# Patient Record
Sex: Male | Born: 1940 | Race: White | Hispanic: No | Smoking: Former smoker
Health system: Southern US, Community
[De-identification: ages and names within clinical notes are randomized; demographics above are authoritative.]

## PROBLEM LIST (undated history)

## (undated) DIAGNOSIS — Z950 Presence of cardiac pacemaker: Secondary | ICD-10-CM

## (undated) DIAGNOSIS — I2510 Atherosclerotic heart disease of native coronary artery without angina pectoris: Secondary | ICD-10-CM

## (undated) DIAGNOSIS — J449 Chronic obstructive pulmonary disease, unspecified: Secondary | ICD-10-CM

## (undated) DIAGNOSIS — I5032 Chronic diastolic (congestive) heart failure: Secondary | ICD-10-CM

## (undated) DIAGNOSIS — G4733 Obstructive sleep apnea (adult) (pediatric): Secondary | ICD-10-CM

## (undated) DIAGNOSIS — I10 Essential (primary) hypertension: Secondary | ICD-10-CM

## (undated) DIAGNOSIS — K922 Gastrointestinal hemorrhage, unspecified: Secondary | ICD-10-CM

## (undated) DIAGNOSIS — E785 Hyperlipidemia, unspecified: Secondary | ICD-10-CM

## (undated) HISTORY — DX: Gastrointestinal hemorrhage, unspecified: K92.2

## (undated) HISTORY — DX: Hyperlipidemia, unspecified: E78.5

## (undated) HISTORY — PX: CORONARY ARTERY BYPASS GRAFT: SHX141

## (undated) HISTORY — DX: Chronic diastolic (congestive) heart failure: I50.32

## (undated) HISTORY — PX: CARDIAC PACEMAKER PLACEMENT: SHX583

## (undated) HISTORY — DX: Atherosclerotic heart disease of native coronary artery without angina pectoris: I25.10

## (undated) HISTORY — DX: Morbid (severe) obesity due to excess calories: E66.01

## (undated) HISTORY — DX: Obstructive sleep apnea (adult) (pediatric): G47.33

## (undated) HISTORY — DX: Presence of cardiac pacemaker: Z95.0

## (undated) HISTORY — DX: Chronic obstructive pulmonary disease, unspecified: J44.9

## (undated) HISTORY — PX: EYE SURGERY: SHX253

## (undated) HISTORY — PX: OTHER SURGICAL HISTORY: SHX169

## (undated) HISTORY — DX: Essential (primary) hypertension: I10

---

## 2006-03-07 ENCOUNTER — Other Ambulatory Visit: Payer: Self-pay

## 2007-03-07 ENCOUNTER — Ambulatory Visit: Payer: Self-pay | Admitting: Internal Medicine

## 2007-03-07 ENCOUNTER — Inpatient Hospital Stay (HOSPITAL_COMMUNITY): Admission: EM | Admit: 2007-03-07 | Discharge: 2007-03-23 | Payer: Self-pay | Admitting: Emergency Medicine

## 2007-03-08 ENCOUNTER — Ambulatory Visit: Payer: Self-pay | Admitting: Thoracic Surgery (Cardiothoracic Vascular Surgery)

## 2007-03-08 ENCOUNTER — Encounter (INDEPENDENT_AMBULATORY_CARE_PROVIDER_SITE_OTHER): Payer: Self-pay | Admitting: Cardiology

## 2007-04-12 ENCOUNTER — Ambulatory Visit: Payer: Self-pay

## 2007-04-18 ENCOUNTER — Ambulatory Visit: Payer: Self-pay | Admitting: Thoracic Surgery (Cardiothoracic Vascular Surgery)

## 2007-04-18 ENCOUNTER — Encounter
Admission: RE | Admit: 2007-04-18 | Discharge: 2007-04-18 | Payer: Self-pay | Admitting: Thoracic Surgery (Cardiothoracic Vascular Surgery)

## 2007-05-02 ENCOUNTER — Ambulatory Visit: Payer: Self-pay | Admitting: Thoracic Surgery (Cardiothoracic Vascular Surgery)

## 2007-05-02 ENCOUNTER — Encounter
Admission: RE | Admit: 2007-05-02 | Discharge: 2007-05-02 | Payer: Self-pay | Admitting: Thoracic Surgery (Cardiothoracic Vascular Surgery)

## 2007-06-15 ENCOUNTER — Other Ambulatory Visit: Payer: Self-pay

## 2007-06-19 ENCOUNTER — Ambulatory Visit: Payer: Self-pay | Admitting: Internal Medicine

## 2007-08-17 ENCOUNTER — Ambulatory Visit: Payer: Self-pay | Admitting: Thoracic Surgery

## 2008-04-24 ENCOUNTER — Other Ambulatory Visit: Payer: Self-pay

## 2008-11-28 ENCOUNTER — Inpatient Hospital Stay (HOSPITAL_COMMUNITY): Admission: EM | Admit: 2008-11-28 | Discharge: 2008-12-01 | Payer: Self-pay | Admitting: Emergency Medicine

## 2009-03-11 DIAGNOSIS — G4733 Obstructive sleep apnea (adult) (pediatric): Secondary | ICD-10-CM | POA: Insufficient documentation

## 2009-03-11 DIAGNOSIS — I10 Essential (primary) hypertension: Secondary | ICD-10-CM | POA: Insufficient documentation

## 2009-03-11 DIAGNOSIS — E785 Hyperlipidemia, unspecified: Secondary | ICD-10-CM | POA: Insufficient documentation

## 2009-03-27 ENCOUNTER — Encounter: Payer: Self-pay | Admitting: Internal Medicine

## 2009-08-19 ENCOUNTER — Encounter: Payer: Self-pay | Admitting: Cardiovascular Disease

## 2009-09-22 ENCOUNTER — Encounter: Payer: Self-pay | Admitting: Internal Medicine

## 2009-09-23 ENCOUNTER — Other Ambulatory Visit: Payer: Self-pay

## 2009-10-25 ENCOUNTER — Inpatient Hospital Stay (HOSPITAL_COMMUNITY): Admission: EM | Admit: 2009-10-25 | Discharge: 2009-10-29 | Payer: Self-pay | Admitting: Emergency Medicine

## 2009-10-25 ENCOUNTER — Encounter: Payer: Self-pay | Admitting: Family Medicine

## 2009-10-25 DIAGNOSIS — R5381 Other malaise: Secondary | ICD-10-CM

## 2009-10-25 DIAGNOSIS — J449 Chronic obstructive pulmonary disease, unspecified: Secondary | ICD-10-CM

## 2009-10-25 DIAGNOSIS — R0609 Other forms of dyspnea: Secondary | ICD-10-CM

## 2009-10-25 DIAGNOSIS — K921 Melena: Secondary | ICD-10-CM

## 2009-10-25 DIAGNOSIS — D649 Anemia, unspecified: Secondary | ICD-10-CM

## 2009-10-25 DIAGNOSIS — I2510 Atherosclerotic heart disease of native coronary artery without angina pectoris: Secondary | ICD-10-CM | POA: Insufficient documentation

## 2009-10-25 DIAGNOSIS — N259 Disorder resulting from impaired renal tubular function, unspecified: Secondary | ICD-10-CM | POA: Insufficient documentation

## 2009-10-25 DIAGNOSIS — J4489 Other specified chronic obstructive pulmonary disease: Secondary | ICD-10-CM | POA: Insufficient documentation

## 2009-10-25 DIAGNOSIS — R0989 Other specified symptoms and signs involving the circulatory and respiratory systems: Secondary | ICD-10-CM

## 2009-10-25 DIAGNOSIS — R5383 Other fatigue: Secondary | ICD-10-CM

## 2009-10-26 DIAGNOSIS — I5032 Chronic diastolic (congestive) heart failure: Secondary | ICD-10-CM

## 2009-10-26 DIAGNOSIS — I2589 Other forms of chronic ischemic heart disease: Secondary | ICD-10-CM

## 2009-11-21 ENCOUNTER — Encounter: Payer: Self-pay | Admitting: Cardiovascular Disease

## 2009-12-08 ENCOUNTER — Encounter: Payer: Self-pay | Admitting: Internal Medicine

## 2009-12-08 ENCOUNTER — Ambulatory Visit: Payer: Self-pay | Admitting: Cardiovascular Disease

## 2009-12-08 DIAGNOSIS — Z950 Presence of cardiac pacemaker: Secondary | ICD-10-CM | POA: Insufficient documentation

## 2010-01-02 ENCOUNTER — Ambulatory Visit: Payer: Self-pay | Admitting: Internal Medicine

## 2010-01-02 ENCOUNTER — Encounter: Payer: Self-pay | Admitting: Internal Medicine

## 2010-04-01 ENCOUNTER — Ambulatory Visit: Payer: Self-pay | Admitting: Internal Medicine

## 2010-04-03 ENCOUNTER — Encounter: Payer: Self-pay | Admitting: Internal Medicine

## 2010-04-22 ENCOUNTER — Encounter: Payer: Self-pay | Admitting: Internal Medicine

## 2010-06-11 ENCOUNTER — Ambulatory Visit: Payer: Self-pay | Admitting: Cardiovascular Disease

## 2010-07-10 ENCOUNTER — Encounter: Payer: Self-pay | Admitting: Internal Medicine

## 2010-07-30 ENCOUNTER — Ambulatory Visit: Payer: Self-pay | Admitting: Internal Medicine

## 2010-07-30 ENCOUNTER — Encounter: Payer: Self-pay | Admitting: Internal Medicine

## 2010-07-31 ENCOUNTER — Encounter (INDEPENDENT_AMBULATORY_CARE_PROVIDER_SITE_OTHER): Payer: Self-pay | Admitting: *Deleted

## 2010-08-07 ENCOUNTER — Encounter (INDEPENDENT_AMBULATORY_CARE_PROVIDER_SITE_OTHER): Payer: Self-pay | Admitting: *Deleted

## 2010-10-08 ENCOUNTER — Telehealth: Payer: Self-pay | Admitting: Cardiovascular Disease

## 2010-10-22 ENCOUNTER — Other Ambulatory Visit: Payer: Self-pay | Admitting: Emergency Medicine

## 2010-10-25 ENCOUNTER — Encounter: Payer: Self-pay | Admitting: Thoracic Surgery (Cardiothoracic Vascular Surgery)

## 2010-11-04 NOTE — Letter (Signed)
Summary: Remote Device Check  [employer], Main Office  [address]   [place], [place] [address]   Phone: [phone]  Fax: [phone]     April 22, 2010 MRN: [id number]   TABIAS SWAYZE [address] [place], [place]  [address]   Dear Mr. [facility],   Your remote transmission was recieved and reviewed by your physician.  All diagnostics were within normal limits for you.  __X___Your next transmission is scheduled for: 07-09-2010.  Please transmit at any time this day.  If you have a wireless device your transmission will be sent automatically.  ______Your next office visit is scheduled for:                              . Please call our office to schedule an appointment.    Sincerely,  Vella Kohler

## 2010-11-04 NOTE — Letter (Signed)
Summary: Remote Device Check  [employer], Main Office  [address]   [place], [place] [address]   Phone: [phone]  Fax: [phone]     August 07, 2010 MRN: [id number]   Scott Krause [address] [place], [place]  [address]   Dear Mr. [facility],   Your remote transmission was recieved and reviewed by your physician.  All diagnostics were within normal limits for you.  ___X__Your next transmission is scheduled for:11/05/2010.   Please transmit at any time this day.  If you have a wireless device your transmission will be sent automatically.  ______Your next office visit is scheduled for:                              . Please call our office to schedule an appointment.    Sincerely,  Altha Harm, LPN
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
## 2010-11-04 NOTE — Progress Notes (Signed)
Summary: PHI  PHI   Imported By: Harlon Flor 12/10/2009 08:42:27  _____________________________________________________________________  External Attachment:    Type:   Image     Comment:   External Document

## 2010-11-04 NOTE — Cardiovascular Report (Signed)
Summary: Office Visit Remote   Office Visit Remote   Imported By: Roderic Ovens 08/10/2010 16:18:50  _____________________________________________________________________  External Attachment:    Type:   Image     Comment:   External Document
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
## 2010-11-04 NOTE — Cardiovascular Report (Signed)
Summary: Office Visit Remote   Office Visit Remote   Imported By: Roderic Ovens 04/23/2010 10:58:24  _____________________________________________________________________  External Attachment:    Type:   Image     Comment:   External Document

## 2010-11-04 NOTE — Miscellaneous (Signed)
Summary: dx code correction  Clinical Lists Changes  Problems: Changed problem from PACEMAKER (ICD-V45.Marland Kitchen01) to PACEMAKER, PERMANENT (ICD-V45.01)  changed the incorrect dx code to correct dx code Genella Mech  July 31, 2010 10:40 AM

## 2010-11-04 NOTE — Letter (Signed)
Summary: Device-Delinquent Phone Journalist, newspaper, Main Office  [address]   [place], [place] [address]   Phone: [phone]  Fax: [phone]     July 10, 2010 MRN: [id number]   TASHON CAPP [address] [place], [place]  [address]   Dear Mr. [facility],  According to our records, you were scheduled for a device phone transmission on 07-09-2010.     We did not receive any results from this check.  If you transmitted on your scheduled day, please call us to help troubleshoot your system.  If you forgot to send your transmission, please send one upon receipt of this letter.  Thank you,   Architectural technologist Device Clinic

## 2010-11-05 ENCOUNTER — Ambulatory Visit: Admit: 2010-11-05 | Payer: Self-pay | Admitting: Internal Medicine

## 2010-11-05 NOTE — Progress Notes (Signed)
Summary: Problems  Phone Note Call from Patient Call back at Home Phone [phone]   Caller: Patient Call For: Lexa Coronado/nurse Summary of Call: pt was working out last night and feel a little discomfort. Pt states there is burning in stomach and feels weak. Initial call taken by: Lysbeth Galas CMA,  October 08, 2010 10:51 AM  Follow-up for Phone Call        Pt states that he is taking prednisone for Gout.  This could be the cause of some of his symptoms. Follow-up by: Harlon Flor,  October 08, 2010 11:02 AM  Additional Follow-up for Phone Call Additional follow up Details #1::        Spoke to pt this afternoon, he states he realized his symptoms were due to prednisone, and after he ate something it resolved.  Additional Follow-up by: Lanny Hurst RN,  October 08, 2010 5:03 PM

## 2010-11-06 ENCOUNTER — Telehealth (INDEPENDENT_AMBULATORY_CARE_PROVIDER_SITE_OTHER): Payer: Self-pay | Admitting: *Deleted

## 2010-11-09 ENCOUNTER — Encounter (INDEPENDENT_AMBULATORY_CARE_PROVIDER_SITE_OTHER): Payer: Self-pay | Admitting: *Deleted

## 2010-11-11 NOTE — Progress Notes (Signed)
  LOV,Echo,Cath faxed to [facility] @ [phone] [facility]  November 06, 2010 12:06 PM

## 2010-11-15 ENCOUNTER — Encounter: Payer: Self-pay | Admitting: Internal Medicine

## 2010-11-16 ENCOUNTER — Encounter (INDEPENDENT_AMBULATORY_CARE_PROVIDER_SITE_OTHER): Payer: MEDICARE

## 2010-11-16 DIAGNOSIS — I495 Sick sinus syndrome: Secondary | ICD-10-CM

## 2010-11-19 NOTE — Letter (Signed)
Summary: Device-Delinquent Phone Journalist, newspaper, Main Office  [address]   [place], [place] [address]   Phone: [phone]  Fax: [phone]     November 09, 2010 MRN: [id number]   ZARION OLIFF [address] [place], [place]  [address]   Dear Mr. [facility],  According to our records, you were scheduled for a device phone transmission on 11-05-2010.     We did not receive any results from this check.  If you transmitted on your scheduled day, please call us to help troubleshoot your system.  If you forgot to send your transmission, please send one upon receipt of this letter.  Thank you,   Architectural technologist Device Clinic

## 2010-12-10 ENCOUNTER — Encounter (INDEPENDENT_AMBULATORY_CARE_PROVIDER_SITE_OTHER): Payer: Self-pay | Admitting: *Deleted

## 2010-12-15 ENCOUNTER — Encounter: Payer: Self-pay | Admitting: Cardiovascular Disease

## 2010-12-15 NOTE — Letter (Signed)
Summary: Device-Delinquent Check  [facility], Main Office  [address]   [place], [place] [address]   Phone: [phone]  Fax: [phone]     December 10, 2010 MRN: [id number]   Scott Krause [address] [place], [place]  [address]   Dear Mr. [facility],  According to our records, you have not had your implanted device checked in the recommended period of time.  We are unable to determine appropriate device function without checking your device on a regular basis.  Please call our office to schedule an appointment as soon as possible.  If you are having your device checked by another physician, please call us so that we may update our records.  Thank you,  Letta Moynahan, EMT  December 10, 2010 1:50 PM  [facility]

## 2010-12-20 LAB — BASIC METABOLIC PANEL
BUN: 33 mg/dL — ABNORMAL HIGH (ref 6–23)
BUN: 66 mg/dL — ABNORMAL HIGH (ref 6–23)
CO2: 32 mEq/L (ref 19–32)
Calcium: 9.2 mg/dL (ref 8.4–10.5)
Chloride: 102 mEq/L (ref 96–112)
Chloride: 99 mEq/L (ref 96–112)
Creatinine, Ser: 1.3 mg/dL (ref 0.4–1.5)
GFR calc Af Amer: 52 mL/min — ABNORMAL LOW (ref >60)
GFR calc non Af Amer: 43 mL/min — ABNORMAL LOW (ref >60)
Potassium: 3.9 mEq/L (ref 3.5–5.1)
Sodium: 140 mEq/L (ref 135–145)

## 2010-12-20 LAB — COMPREHENSIVE METABOLIC PANEL WITH GFR
ALT: 13 U/L (ref 0–53)
ALT: 13 U/L (ref 0–53)
AST: 13 U/L (ref 0–37)
AST: 14 U/L (ref 0–37)
Albumin: 3.2 g/dL — ABNORMAL LOW (ref 3.5–5.2)
Albumin: 3.5 g/dL (ref 3.5–5.2)
Alkaline Phosphatase: 42 U/L (ref 39–117)
Alkaline Phosphatase: 44 U/L (ref 39–117)
BUN: 54 mg/dL — ABNORMAL HIGH (ref 6–23)
BUN: 63 mg/dL — ABNORMAL HIGH (ref 6–23)
CO2: 32 meq/L (ref 19–32)
CO2: 35 meq/L — ABNORMAL HIGH (ref 19–32)
Calcium: 8.8 mg/dL (ref 8.4–10.5)
Calcium: 9 mg/dL (ref 8.4–10.5)
Chloride: 90 meq/L — ABNORMAL LOW (ref 96–112)
Chloride: 92 meq/L — ABNORMAL LOW (ref 96–112)
Creatinine, Ser: 2.26 mg/dL — ABNORMAL HIGH (ref 0.4–1.5)
Creatinine, Ser: 2.29 mg/dL — ABNORMAL HIGH (ref 0.4–1.5)
GFR calc non Af Amer: 28 mL/min — ABNORMAL LOW
GFR calc non Af Amer: 29 mL/min — ABNORMAL LOW
Glucose, Bld: 112 mg/dL — ABNORMAL HIGH (ref 70–99)
Glucose, Bld: 209 mg/dL — ABNORMAL HIGH (ref 70–99)
Potassium: 3.8 meq/L (ref 3.5–5.1)
Potassium: 4.1 meq/L (ref 3.5–5.1)
Sodium: 137 meq/L (ref 135–145)
Sodium: 138 meq/L (ref 135–145)
Total Bilirubin: 0.5 mg/dL (ref 0.3–1.2)
Total Bilirubin: 1.2 mg/dL (ref 0.3–1.2)
Total Protein: 6.8 g/dL (ref 6.0–8.3)
Total Protein: 7.3 g/dL (ref 6.0–8.3)

## 2010-12-20 LAB — CBC
HCT: 26.7 % — ABNORMAL LOW (ref 39.0–52.0)
HCT: 27 % — ABNORMAL LOW (ref 39.0–52.0)
HCT: 27.4 % — ABNORMAL LOW (ref 39.0–52.0)
HCT: 28.1 % — ABNORMAL LOW (ref 39.0–52.0)
Hemoglobin: 9.4 g/dL — ABNORMAL LOW (ref 13.0–17.0)
MCHC: 33.1 g/dL (ref 30.0–36.0)
MCHC: 34 g/dL (ref 30.0–36.0)
MCHC: 35 g/dL (ref 30.0–36.0)
MCV: 88.2 fL (ref 78.0–100.0)
MCV: 89 fL (ref 78.0–100.0)
MCV: 89.3 fL (ref 78.0–100.0)
MCV: 89.4 fL (ref 78.0–100.0)
Platelets: 191 10*3/uL (ref 150–400)
Platelets: 193 K/uL (ref 150–400)
Platelets: 195 10*3/uL (ref 150–400)
Platelets: 215 10*3/uL (ref 150–400)
Platelets: 220 10*3/uL (ref 150–400)
RBC: 3 MIL/uL — ABNORMAL LOW (ref 4.22–5.81)
RBC: 3.06 MIL/uL — ABNORMAL LOW (ref 4.22–5.81)
RBC: 3.07 MIL/uL — ABNORMAL LOW (ref 4.22–5.81)
RBC: 3.15 MIL/uL — ABNORMAL LOW (ref 4.22–5.81)
RDW: 14.4 % (ref 11.5–15.5)
WBC: 10.6 10*3/uL — ABNORMAL HIGH (ref 4.0–10.5)
WBC: 12.2 10*3/uL — ABNORMAL HIGH (ref 4.0–10.5)
WBC: 16 10*3/uL — ABNORMAL HIGH (ref 4.0–10.5)
WBC: 9.3 K/uL (ref 4.0–10.5)

## 2010-12-20 LAB — CARDIAC PANEL(CRET KIN+CKTOT+MB+TROPI)
CK, MB: 1.3 ng/mL (ref 0.3–4.0)
CK, MB: 2.1 ng/mL (ref 0.3–4.0)
Relative Index: INVALID (ref 0.0–2.5)
Total CK: 102 U/L (ref 7–232)
Troponin I: 0.01 ng/mL (ref 0.00–0.06)
Troponin I: 0.01 ng/mL (ref 0.00–0.06)

## 2010-12-20 LAB — TECHNOLOGIST SMEAR REVIEW

## 2010-12-20 LAB — DIFFERENTIAL
Basophils Absolute: 0 K/uL (ref 0.0–0.1)
Basophils Relative: 0 % (ref 0–1)
Eosinophils Absolute: 0.1 K/uL (ref 0.0–0.7)
Eosinophils Relative: 1 % (ref 0–5)
Lymphocytes Relative: 9 % — ABNORMAL LOW (ref 12–46)
Lymphs Abs: 0.9 K/uL (ref 0.7–4.0)
Monocytes Absolute: 0.9 K/uL (ref 0.1–1.0)
Monocytes Relative: 9 % (ref 3–12)
Neutro Abs: 8.7 K/uL — ABNORMAL HIGH (ref 1.7–7.7)
Neutrophils Relative %: 81 % — ABNORMAL HIGH (ref 43–77)

## 2010-12-20 LAB — BRAIN NATRIURETIC PEPTIDE: Pro B Natriuretic peptide (BNP): 55 pg/mL (ref 0.0–100.0)

## 2010-12-20 LAB — BASIC METABOLIC PANEL WITH GFR
BUN: 54 mg/dL — ABNORMAL HIGH (ref 6–23)
CO2: 32 meq/L (ref 19–32)
Calcium: 8.6 mg/dL (ref 8.4–10.5)
Chloride: 100 meq/L (ref 96–112)
Creatinine, Ser: 1.42 mg/dL (ref 0.4–1.5)
GFR calc non Af Amer: 49 mL/min — ABNORMAL LOW
Glucose, Bld: 104 mg/dL — ABNORMAL HIGH (ref 70–99)
Potassium: 3.6 meq/L (ref 3.5–5.1)
Sodium: 140 meq/L (ref 135–145)

## 2010-12-20 LAB — POCT CARDIAC MARKERS
CKMB, poc: 1.2 ng/mL (ref 1.0–8.0)
Myoglobin, poc: 362 ng/mL (ref 12–200)
Troponin i, poc: <0.05 ng/mL (ref 0.00–0.09)

## 2010-12-20 LAB — HEMOCCULT GUIAC POC 1CARD (OFFICE): Fecal Occult Bld: POSITIVE

## 2010-12-20 LAB — CK TOTAL AND CKMB (NOT AT ARMC)
CK, MB: 0.7 ng/mL (ref 0.3–4.0)
Relative Index: INVALID (ref 0.0–2.5)
Total CK: 74 U/L (ref 7–232)

## 2010-12-20 LAB — TYPE AND SCREEN: ABO/RH(D): B POS

## 2010-12-20 LAB — SEDIMENTATION RATE: Sed Rate: 106 mm/h — ABNORMAL HIGH (ref 0–16)

## 2010-12-20 LAB — HEMOGLOBIN AND HEMATOCRIT, BLOOD
HCT: 27.8 % — ABNORMAL LOW (ref 39.0–52.0)
Hemoglobin: 9.3 g/dL — ABNORMAL LOW (ref 13.0–17.0)

## 2010-12-20 LAB — HEMOGLOBIN A1C: Mean Plasma Glucose: 126 mg/dL

## 2010-12-20 LAB — PREPARE RBC (CROSSMATCH)

## 2010-12-20 LAB — SODIUM, URINE, RANDOM: Sodium, Ur: 42 meq/L

## 2010-12-20 LAB — CREATININE, URINE, RANDOM: Creatinine, Urine: 72 mg/dL

## 2010-12-20 LAB — PATHOLOGIST SMEAR REVIEW

## 2010-12-21 ENCOUNTER — Encounter: Payer: Self-pay | Admitting: *Deleted

## 2010-12-22 ENCOUNTER — Encounter: Payer: Self-pay | Admitting: Cardiovascular Disease

## 2010-12-22 ENCOUNTER — Ambulatory Visit (INDEPENDENT_AMBULATORY_CARE_PROVIDER_SITE_OTHER): Payer: Medicare Other | Admitting: Cardiovascular Disease

## 2010-12-22 VITALS — BP 141/81 | HR 84 | Ht 68.0 in | Wt 266.0 lb

## 2010-12-22 DIAGNOSIS — I5032 Chronic diastolic (congestive) heart failure: Secondary | ICD-10-CM

## 2010-12-22 DIAGNOSIS — E669 Obesity, unspecified: Secondary | ICD-10-CM

## 2010-12-22 DIAGNOSIS — G4733 Obstructive sleep apnea (adult) (pediatric): Secondary | ICD-10-CM

## 2010-12-22 DIAGNOSIS — Z951 Presence of aortocoronary bypass graft: Secondary | ICD-10-CM

## 2010-12-22 DIAGNOSIS — E785 Hyperlipidemia, unspecified: Secondary | ICD-10-CM

## 2010-12-22 DIAGNOSIS — I10 Essential (primary) hypertension: Secondary | ICD-10-CM

## 2010-12-22 DIAGNOSIS — I2510 Atherosclerotic heart disease of native coronary artery without angina pectoris: Secondary | ICD-10-CM

## 2010-12-22 MED ORDER — ATORVASTATIN CALCIUM 40 MG PO TABS
20.0000 mg | ORAL_TABLET | Freq: Every day | ORAL | Status: DC
Start: 2010-12-22 — End: 2011-03-30

## 2010-12-22 NOTE — Progress Notes (Deleted)
   Patient ID: Scott Krause, male    DOB: 04-11-1941, 70 y.o.   MRN: [id number]  HPI    Review of Systems    Physical Exam

## 2010-12-22 NOTE — Assessment & Plan Note (Signed)
No active signs of heart failure. He will continue on Lasix in the morning. We will hold his Bumex given that they are both loop diuretics. He is watching his salt intake, fluid intake. He is also losing weight which will help his shortness of breath.

## 2010-12-22 NOTE — Assessment & Plan Note (Signed)
We will change him to Lipitor 20 mg daily, check his cholesterol in 3 months and titrated up to 40 mg if needed. Goal LDL is less than 70.

## 2010-12-22 NOTE — Assessment & Plan Note (Signed)
last catheterization in November 2010 showing patent grafts. No symptoms of angina at this time. We'll not perform any testing.

## 2010-12-22 NOTE — Assessment & Plan Note (Signed)
And blood pressure is well controlled on his medication regimen. No changes made.

## 2010-12-22 NOTE — Progress Notes (Addendum)
   Patient ID: Scott Krause, male    DOB: 26-May-1941, 70 y.o.   MRN: [id number]  HPI Scott Krause is a 70 year old gentleman with obesity, coronary artery disease, bypass surgery with severe native three-vessel disease, obstructive sleep apnea, left catheterization Feb 2010 showing patent grafts, with pacemaker for tachybrady syndrome,  who presents for routine follow up.   Overall he reports that he is doing well. He has been playing golf, denies any chest pain or shortness of breath. He is interested in changing his Crestor to something cheaper, such as Lipitor. He also reports that he is taking furosemide as well as Bumex. He knows they are the same and is uncertain why he is taking both. He continues to struggle with his weight and reports that he is walking on an occasional basis and try to watch his diet.  He had pneumonia in January 2011 and was admitted to [facility] for antibiotics. He was diagnosed with upper GI bleed and had an outpatient EGD in February 2011. He was discharged initially on Avelox. His kidney function was abnormal on admission with a creatinine greater than 2 and BUN 54 consistent with dehydration. His lower extremity edema has been stable and he is cut back on his diuretic from two a day to one a day.  EKG shows A-paced rhythm with rate 84 beats per minute, no significant ST or T wave changes.   Review of Systems  Constitutional: Negative.   HENT: Negative.   Eyes: Negative.   Respiratory: Negative.   Cardiovascular: Negative.   Gastrointestinal: Negative.   Musculoskeletal: Negative.   Skin: Negative.   Neurological: Negative.   Hematological: Negative.   Psychiatric/Behavioral: Negative.       Physical Exam  Constitutional: He appears well-developed and well-nourished.       Obese  HENT:  Head: Normocephalic and atraumatic.  Eyes: Conjunctivae are normal. Pupils are equal, round, and reactive to light.  Neck: Neck supple. No JVD present.    Cardiovascular: Normal rate, regular rhythm, normal heart sounds and intact distal pulses.   No murmur heard. Pulmonary/Chest: Effort normal and breath sounds normal. No respiratory distress. He has no rales.  Abdominal: Soft. Bowel sounds are normal.       obese  Musculoskeletal: Normal range of motion.       Trace LE edema above the socks  Skin: Skin is warm and dry. No rash noted. No erythema.  Psychiatric: He has a normal mood and affect. His behavior is normal. Judgment and thought content normal.      EKG shows paced rhythm, rate 84 beats per minute.

## 2010-12-22 NOTE — Patient Instructions (Addendum)
Your physician recommends that you schedule a follow-up appointment in: 6 months  Your physician recommends that you return for lab work in: 3 months   

## 2010-12-31 NOTE — Letter (Signed)
Summary: Remote Device Check  [employer], Main Office  [address]   [place], [place] [address]   Phone: [phone]  Fax: [phone]     December 21, 2010 MRN: [id number]   KONG [facility]T [address] [place], [place]  [address]   Dear Mr. [facility],   Your remote transmission was recieved and reviewed by your physician.  All diagnostics were within normal limits for you.   ___X___Your next office visit is scheduled for:  03-11-11 @ 930 with Dr Ladona Ridgel.     Sincerely,  Vella Kohler

## 2010-12-31 NOTE — Cardiovascular Report (Signed)
Summary: Office Visit   Office Visit   Imported By: Roderic Ovens 12/22/2010 15:08:31  _____________________________________________________________________  External Attachment:    Type:   Image     Comment:   External Document

## 2011-01-19 LAB — COMPREHENSIVE METABOLIC PANEL
ALT: 19 U/L (ref 0–53)
AST: 16 U/L (ref 0–37)
Albumin: 3.7 g/dL (ref 3.5–5.2)
Alkaline Phosphatase: 58 U/L (ref 39–117)
BUN: 24 mg/dL — ABNORMAL HIGH (ref 6–23)
CO2: 31 mEq/L (ref 19–32)
Calcium: 9.6 mg/dL (ref 8.4–10.5)
Chloride: 106 mEq/L (ref 96–112)
Creatinine, Ser: 1.16 mg/dL (ref 0.4–1.5)
GFR calc Af Amer: >60 mL/min (ref >60)
GFR calc non Af Amer: >60 mL/min (ref >60)
Glucose, Bld: 97 mg/dL (ref 70–99)
Potassium: 4.2 mEq/L (ref 3.5–5.1)
Sodium: 144 mEq/L (ref 135–145)
Total Bilirubin: 0.5 mg/dL (ref 0.3–1.2)
Total Protein: 6.5 g/dL (ref 6.0–8.3)

## 2011-01-19 LAB — LIPID PANEL
Cholesterol: 121 mg/dL (ref 0–200)
HDL: 23 mg/dL — ABNORMAL LOW (ref >39)
Triglycerides: 127 mg/dL (ref <150)

## 2011-01-19 LAB — CBC
HCT: 37.6 % — ABNORMAL LOW (ref 39.0–52.0)
Hemoglobin: 12 g/dL — ABNORMAL LOW (ref 13.0–17.0)
Hemoglobin: 12.2 g/dL — ABNORMAL LOW (ref 13.0–17.0)
Hemoglobin: 13 g/dL (ref 13.0–17.0)
Hemoglobin: 13.2 g/dL (ref 13.0–17.0)
MCHC: 34.7 g/dL (ref 30.0–36.0)
MCHC: 34.8 g/dL (ref 30.0–36.0)
MCV: 89.8 fL (ref 78.0–100.0)
Platelets: 182 10*3/uL (ref 150–400)
RBC: 3.83 MIL/uL — ABNORMAL LOW (ref 4.22–5.81)
RBC: 3.9 MIL/uL — ABNORMAL LOW (ref 4.22–5.81)
RBC: 4.19 MIL/uL — ABNORMAL LOW (ref 4.22–5.81)
RDW: 13.3 % (ref 11.5–15.5)
RDW: 13.5 % (ref 11.5–15.5)
WBC: 10.3 10*3/uL (ref 4.0–10.5)
WBC: 7.9 10*3/uL (ref 4.0–10.5)
WBC: 9.3 10*3/uL (ref 4.0–10.5)

## 2011-01-19 LAB — BASIC METABOLIC PANEL
CO2: 28 mEq/L (ref 19–32)
CO2: 29 mEq/L (ref 19–32)
Calcium: 8.9 mg/dL (ref 8.4–10.5)
Calcium: 9.1 mg/dL (ref 8.4–10.5)
Chloride: 104 mEq/L (ref 96–112)
Chloride: 105 mEq/L (ref 96–112)
Creatinine, Ser: 1.1 mg/dL (ref 0.4–1.5)
GFR calc Af Amer: >60 mL/min (ref >60)
GFR calc Af Amer: >60 mL/min (ref >60)
GFR calc non Af Amer: >60 mL/min (ref >60)
Glucose, Bld: 106 mg/dL — ABNORMAL HIGH (ref 70–99)
Glucose, Bld: 95 mg/dL (ref 70–99)
Potassium: 3.8 mEq/L (ref 3.5–5.1)
Sodium: 138 mEq/L (ref 135–145)
Sodium: 138 mEq/L (ref 135–145)
Sodium: 142 mEq/L (ref 135–145)

## 2011-01-19 LAB — MAGNESIUM: Magnesium: 2 mg/dL (ref 1.5–2.5)

## 2011-01-19 LAB — CARDIAC PANEL(CRET KIN+CKTOT+MB+TROPI)
CK, MB: 2.3 ng/mL (ref 0.3–4.0)
CK, MB: 2.4 ng/mL (ref 0.3–4.0)
Relative Index: INVALID (ref 0.0–2.5)
Relative Index: INVALID (ref 0.0–2.5)
Total CK: 53 U/L (ref 7–232)
Total CK: 80 U/L (ref 7–232)
Troponin I: <0.01 ng/mL (ref 0.00–0.06)
Troponin I: <0.01 ng/mL (ref 0.00–0.06)

## 2011-01-19 LAB — POCT I-STAT, CHEM 8
BUN: 28 mg/dL — ABNORMAL HIGH (ref 6–23)
Calcium, Ion: 1.02 mmol/L — ABNORMAL LOW (ref 1.12–1.32)
Chloride: 107 mEq/L (ref 96–112)
Creatinine, Ser: 1.4 mg/dL (ref 0.4–1.5)
Glucose, Bld: 98 mg/dL (ref 70–99)
HCT: 37 % — ABNORMAL LOW (ref 39.0–52.0)
Hemoglobin: 12.6 g/dL — ABNORMAL LOW (ref 13.0–17.0)
Potassium: 4.1 mEq/L (ref 3.5–5.1)
Sodium: 140 mEq/L (ref 135–145)
TCO2: 27 mmol/L (ref 0–100)

## 2011-01-19 LAB — PROTIME-INR: Prothrombin Time: 12.6 seconds (ref 11.6–15.2)

## 2011-01-19 LAB — POCT CARDIAC MARKERS
CKMB, poc: 1.9 ng/mL (ref 1.0–8.0)
Myoglobin, poc: 107 ng/mL (ref 12–200)
Troponin i, poc: <0.05 ng/mL (ref 0.00–0.09)

## 2011-01-19 LAB — APTT: aPTT: 23 seconds — ABNORMAL LOW (ref 24–37)

## 2011-01-19 LAB — DIFFERENTIAL
Lymphocytes Relative: 16 % (ref 12–46)
Lymphs Abs: 1.6 10*3/uL (ref 0.7–4.0)
Neutro Abs: 7.9 10*3/uL — ABNORMAL HIGH (ref 1.7–7.7)
Neutrophils Relative %: 77 % (ref 43–77)

## 2011-01-19 LAB — TSH: TSH: 1.817 u[IU]/mL (ref 0.350–4.500)

## 2011-01-19 LAB — HEPARIN LEVEL (UNFRACTIONATED)
Heparin Unfractionated: 0.12 IU/mL — ABNORMAL LOW (ref 0.30–0.70)
Heparin Unfractionated: <0.1 IU/mL — ABNORMAL LOW (ref 0.30–0.70)

## 2011-01-19 LAB — CK TOTAL AND CKMB (NOT AT ARMC): Total CK: 71 U/L (ref 7–232)

## 2011-01-25 ENCOUNTER — Other Ambulatory Visit: Payer: Self-pay

## 2011-01-25 MED ORDER — FUROSEMIDE 40 MG PO TABS: 40.0000 mg | ORAL_TABLET | Freq: Every day | ORAL | Status: DC

## 2011-02-01 ENCOUNTER — Other Ambulatory Visit: Payer: Self-pay | Admitting: Emergency Medicine

## 2011-02-01 MED ORDER — FUROSEMIDE 40 MG PO TABS: 40.0000 mg | ORAL_TABLET | Freq: Every day | ORAL | Status: DC

## 2011-02-05 ENCOUNTER — Other Ambulatory Visit: Payer: Self-pay

## 2011-02-05 MED ORDER — RAMIPRIL 10 MG PO CAPS: 10.0000 mg | ORAL_CAPSULE | Freq: Every day | ORAL | Status: DC

## 2011-02-16 NOTE — Consult Note (Signed)
NAMEANTWOIN, LACKEY NO.:  [id number]   MEDICAL RECORD NO.:  [id number]          PATIENT TYPE:  INP   LOCATION:  2316                         FACILITY:  [facility]   PHYSICIAN:  Duke Salvia, MD, FACCDATE OF BIRTH:  05/26/1941   DATE OF CONSULTATION:  03/16/2007  DATE OF DISCHARGE:                                 CONSULTATION   Thank you very much for asking me to see Scott Krause in  consultation because of pauses and ventricular tachycardia.   Scott Krause is a 70 year old gentleman with a 1-year history of angina  who was admitted for catheterization and submitted for bypass surgery  because of significant three-vessel disease.  Left ventricular was  relatively normal at 50%.   He underwent successful bypass surgery plan, but his postoperative  course was notable for:   1. Paroxysmal atrial arrhythmias.  2. A significant post termination pause on June 10 that occurred at      5:40 in the morning where he had 1 beat in about 9 seconds that was      associated with presyncope and preceded by some palpitations which      he is not sure whether he recognized or did not recognize and then      subsequently ventricular tachycardia primarily in the last 24 hours      that also creates a fluttering which he thinks he recalls from      before surgery.   The patient has had a couple of episodes of presyncope.  These have  occurred while doing things like walking around the grocery store and  have been rather brief.   PAST MEDICAL HISTORY:  Notable for COPD, obstructive sleep apnea,  dyslipidemia, hypertension.   SOCIAL HISTORY:  He is married.  He has two children.  He does not  smoke.  He does drink occasionally.  Denies use of recreational drugs.   REVIEW OF SYSTEMS:  As noted previously or is noncontributory.   MEDICATIONS:  1. Amlodipine 10 currently on hold.  2. Protonix.  3. Tricor 145.  4. Lopressor 25.  5. Insulin h.s.  6. Norvasc 10.  7.  Aspirin 81.  8. Crestor.  9. Amiodarone 400 b.i.d..   ALLERGIES:  He has no known drug allergies.   PHYSICAL EXAMINATION:  GENERAL:  He is an older Caucasian male appearing  his stated age of 36.  VITAL SIGNS:  Blood pressure 130/47, pulse 73.  HEENT:  Demonstrated no icterus or xanthoma.  NECK:  The neck veins were flat.  Carotids were brisk and full  bilaterally without bruits.  BACK:  Without kyphosis, scoliosis.  LUNGS:  Lung sounds were rather coarse with decreased sounds  particularly in the right base.  HEART:  Heart sounds were regular without a rub.  ABDOMEN:  Soft with active bowel sounds.  EXTREMITIES:  Femoral pulses were not examined.  Distal pulses were  trace.  There is no clubbing or cyanosis.  There is a little bit of  edema.  NEUROLOGICAL:  Grossly normal.  SKIN:  Warm and dry.   Electrocardiogram dated 10  July demonstrated sinus rhythm at 67 with  intervals of 0.16/0.11/0.42.  The axis was 45 degrees.  There is an R  prime in lead V1.  There is evidence of a possible prior inferior wall  MI.   Electrocardiogram with nonsustained ventricular tachycardia was caught  on two serendipitous occasions both consistent with a left bundle  inferior axis morphology with a relatively narrow QRS.   IMPRESSION:  1. 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  2. Postoperative atrial fibrillation and flutter with a post      termination pause.  3. Ischemic cardiomyopathy.      a.     Status post CABG.      b.     Preoperative ejection fraction 50%.  4. Chronic obstructive pulmonary disease - severe.  5. Obstructive sleep apnea.  6. Thromboembolic risk factors notable for hypertension.  7. Prior presyncope.  8. Amiodarone therapy currently for #1 and #2.   DISCUSSION:  Scott Krause has a number of arrhythmias.  The first is the  atrial fibrillation and flutter which is seen  postoperatively with a  rapid ventricular response.  He has never been told that he has had  atrial fibrillation before; he does not recognize the words in any case.  He does have a history of antecedent palpitations, however (see below).  The postoperative phase has been complicated by these atrial  arrhythmias, and he has significant post termination pausing.  This is a  second major issue.  These are related in that the likelihood of his  having future atrial fibrillation directly relates to his likelihood of  having post termination pauses, and with the significant pauses that he  has symptoms are likely of presyncope and syncope.  I would have a very  low threshold for considering pacing for preventing post termination  pauses in this gentleman who is a) relatively older and  ) has COPD both  of which are contributors to the likelihood of recurrent atrial  fibrillation especially in the context of his hypertensive heart disease  and diastolic dysfunction.   The third issue is his ventricular tachycardia.  Morphologically this  appears to be emerging from the right ventricular outflow tract which  would make it most likely unrelated to his coronary artery disease.  It  is likely being provoked by the high catecholamine situation following  surgery.  What is interesting is that it was somewhat delayed before it  manifested, and I wonder whether this has something to do with the  dosing of his beta blockers that may have been adjusted around the time  of his post termination pause that was described as asystole.   In any case, given the fact that he has had palpitations before it is  not clear whether the ventricular tachycardia or atrial fibrillation is  responsible for that.  In the absence of symptoms of syncope or  significant palpitations, nothing specific need be done about this  ventricular tachycardia.  In the event that he does continue to have presyncope, an implanted device  would certainly allow [place] to correlate  whether the episode related to atrial fibrillation or ventricular  tachycardia, and then further therapies could be directed specifically  using drug therapy with beta blockers or calcium channel blockers or  catheter ablation.   It is, however, imperative to exclude changes in his heart status that  might alternatively explain this ventricular  arrhythmia.  This could  include myocardial ischemia, myocardial infarction, changes in the  electromechanical coupling, related changes in hemodynamics.   RECOMMENDATIONS:  Based on the above I would therefore:  1. Check serial enzymes.  2. Check a 2-D echo.  3. Likely proceed with pacemaker implantation.  4. Increase his beta blockers.  5. Will need to clarify arrhythmia mechanism in the event that he has      future symptoms of presyncope.   Thank you very much for the consultation.      Duke Salvia, MD, [facility]  Electronically Signed     SCK/MEDQ  D:  03/16/2007  T:  03/17/2007  Job:  [id number]   cc:   Cristy Hilts. Jacinto Halim, MD  Salvatore Decent Dorris Fetch, M.D.  Dewaine Oats

## 2011-02-16 NOTE — Letter (Signed)
August 17, 2007   Cristy Hilts. Jacinto Halim, MD  [address]  [place], [place]  [address]   Re:  JERIMIAH, WOLMAN              DOB:  1941/01/06   Dear Dr. Jacinto Halim:   I saw Mr. Jentz in the office today for Dr. Dorris Fetch who is at [place].  His blood pressure is 155/71, pulse 86, respirations 20, sats  were 90%.  He returned over here for follow-up of pleural effusion. I  reviewed his CT scan and his chest x-ray and does have a small left  pleural effusion but really not enough that I think it is symptomatic or  requires intervention at this time.  I think the best thing for me to do  is just to follow this.  His lungs are clear to auscultation and  percussion bilaterally.  He has a pacemaker inserted.  He is being  followed by Dr. Graciela Husbands.  I will have Dr. Dorris Fetch see him with a chest  x-ray in four weeks.   Ines Bloomer, M.D.  Electronically Signed   DPB/MEDQ  D:  08/17/2007  T:  08/18/2007  Job:  [id number]

## 2011-02-16 NOTE — Discharge Summary (Signed)
Scott Krause, JAQUITH NO.:  [id number]   MEDICAL RECORD NO.:  [id number]          PATIENT TYPE:  INP   LOCATION:  2015                         FACILITY:  [facility]   PHYSICIAN:  Salvatore Decent. Dorris Fetch, M.D.DATE OF BIRTH:  04/13/1941   DATE OF ADMISSION:  03/07/2007  DATE OF DISCHARGE:  03/22/2007                               DISCHARGE SUMMARY   DISCHARGE DIAGNOSES:  1. 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  2. Repetitive ventricular tachycardia with a left bundle branch block,      inferior axis morphology consistent with right ventricular outflow      tract mechanism.  3. Postoperative atrial fibrillation/flutter with post termination      pause.  4. Volume overload.  5. Acute blood loss anemia postoperatively.  6. Remote tobacco abuse.  The patient quit 10 years ago.  7. Obstructive sleep apnea.  8. Hyperlipidemia.  9. Hypertension.  10.Severe chronic obstructive pulmonary disease.   PROCEDURES:  1. Coronary bypass grafting x4 using a left internal mammary artery to      left anterior descending artery, saphenous vein graft to first      diagonal, saphenous vein graft to obtuse marginal-2, saphenous vein      graft to posterior descending.  2. Placement of dual chamber pacemaker.  3. Cardiac catheterization.   HISTORY OF PRESENT ILLNESS:  Scott Krause is a 70 year old gentleman with  about a 1-year history of angina.  He has recently had progression of  symptoms and was admitted with diastolic heart failure and unstable  angina.  The patient was taken for cardiac catheterization.  This showed  severe three-vessel coronary artery disease.  Dr. Dorris Fetch was  consulted.  Dr. Dorris Fetch saw and evaluated the patient.  He discussed  with the patient undergoing coronary bypass grafting.  Risks and  benefits were discussed with the patient.  The patient acknowledged  understanding and agreed to proceed.  Surgery was scheduled for March 10, 2007.  Preoperatively, the patient underwent bilateral carotid duplex  ultrasound showing no significant ICA stenosis.  He had palpable pulses  with extremities x4.  The patient remained stable preoperatively.  For  details of the patient's past medical history and physical exam, please  see dictated H&P.   HOSPITAL COURSE:  The patient was taken to the operating room on March 10, 2007 where he underwent coronary artery bypass grafting x4 using a left  internal mammary artery to left anterior descending artery, saphenous  vein graft to first diagonal, saphenous vein graft to obtuse marginal-2,  saphenous vein graft to posterior descending.  The patient tolerated  this procedure well and was transferred to the intensive care unit in  stable condition.  Postoperatively, the patient was noted to be  hemodynamically stable.  He was extubated evening of surgery.  Following  extubation, the patient noted to be alert and oriented x4.  On postop  day #1, the patient's vital signs were noted to be stable.  He was able  to be weaned from drips.  Postoperative chest x-ray was stable with no  pneumothorax or significant pleural  effusion.  Chest tube drainage was  stable.  Chest tubes were discontinued on postop day #1.  The patient  continued to progress well over the next several days.  He was out of  bed ambulating with assistance.  He was transferred out to 2000 postop  day #3.  The evening of transfer, the patient became confused and  disoriented to time and place.  The patient would not keep CPAP on and  we were contacted.  We were able to replace CPAP with saturations  maintained greater than 90%..  The following morning early, the patient  was noted to have developed ventricular tachycardia.  The patient had  multiple beats of ventricular tachycardia.  He then developed  bradycardia down in the 30s followed by atrial fibrillation with rate in  the 130s.  Dr. Donata Clay was consulted and the  patient was transferred  back to 2300.  The patient was started on IV amiodarone for  postoperative atrial fibrillation.  He did convert back to normal sinus  rhythm.  The patient remained in normal sinus rhythm overnight and  amiodarone was switched to p.o. dose.  During next day, the patient  continued to have brief episodes of tachycardia as well as atrial  fibrillation.  Dr. Graciela Husbands was consulted for evaluation.  Dr. Graciela Husbands noted  the patient had recurrence of ventricular tachycardia with left bundle  branch block with inferior axis morphology consistent with RVOT  mechanism.  Also, no postoperative atrial fibrillation with flutter with  a post termination pause.  At time, questioned placement of pacemaker.  After further discussion with the patient and Dr. Tyrone Sage, pacemaker  was placed on March 20, 2007, without difficulty.  It was interrogated  the following day and noted to be functioning well.  Prior to  implantation of pacemaker, the patient was started on Coumadin.  This  was discontinued after implantation of pacemaker.  The patient noted to  be in normal sinus rhythm prior to discharge.  The patient was continued  on Toprol as well as Norvasc.  Blood pressure is noted to be stable.  From a pulmonary status, the patient remained stable.  He was able to  continue working on incentive spirometer and be weaned off oxygen  saturating greater than 90% on room air.  The patient's incisions are  clean, dry and intact and healing well.  His vital signs were monitored.  They are noted to be stable.  The patient is afebrile.  The patient  continued to ambulate well with cardiac rehab.  He was tolerating diet  well with no nausea or vomiting noted.  The patient did develop some  volume overload postoperatively requiring several diuretics.  He is back  near baseline prior to discharge home.   The patient is tentatively ready for discharge home in the next 1-2 days.   LABORATORY DATA AND  X-RAY FINDINGS:  On June 15, labs showed a white  count of 14.7, hemoglobin 10.8, hematocrit 31.6, platelet count 433.  Sodium of 140, potassium 4.3, chloride of 102, bicarb of 30, BUN of 39,  creatinine 1.33, glucose 99.  INR on June 18, was 1.1.   FOLLOW UP:  Followup appointment has been arranged with Dr. Dorris Fetch  for April 18, 2007, at 2 p.m.  The patient will need to obtain a PA and  lateral chest x-ray at 1:30 p.m. on this date.  The patient need to  follow up Dr. Rana Snare in the 2 weeks.  He will need  to contact Dr. Vance Gather  office to make these arrangements.   ACTIVITY:  The patient instructed in no driving, no lifting over 10  pounds.  The patient is told to ambulate three to four times per day as  tolerated and to continue breathing exercises.   WOUND CARE:  The patient is told to shower washing his incisions using  soap and water.  He is to contact the office if he develops any drainage  or opening from any incision sites.   DIET:  The patient is to continue on diet to be low-fat, low-salt.   DISCHARGE MEDICATIONS:  1. Aspirin 325 mg daily.  2. Lopressor 50 mg t.i.d.  3. Crestor 5 mg at night.  4. Norvasc 10 mg daily.  5. Tricor 145 mg daily.  6. Zetia 10 mg daily.  7. Amiodarone 200 mg daily.  8. Oxycodone 5 mg one to two tablets q.4-6 h. p.r.n. pain.  9. Plan to start an ARB as an outpatient.  It was not started during      his postoperative course secondary to creatinine being on the      higher end.      Theda Belfast, [place]      Viviann Spare C. Dorris Fetch, M.D.  Electronically Signed    KMD/MEDQ  D:  03/22/2007  T:  03/23/2007  Job:  [id number]   cc:   Rana Snare, M.D.

## 2011-02-16 NOTE — Consult Note (Signed)
Scott Krause, Scott Krause NO.:  [id number]   MEDICAL RECORD NO.:  [id number]          PATIENT TYPE:  INP   LOCATION:  6529                         FACILITY:  [facility]   PHYSICIAN:  Salvatore Decent. Dorris Fetch, M.D.DATE OF BIRTH:  29-Dec-1940   DATE OF CONSULTATION:  DATE OF DISCHARGE:                                 CONSULTATION   REASON FOR CONSULTATION:  Three-vessel coronary disease.   HISTORY OF PRESENT ILLNESS:  Scott Krause is a 70 year old gentleman with  multiple cardiac risk factors.  He has had a cardiac workup in the past,  which was negative, about 10 years ago.  Over the past year, he has been  having exertional chest discomfort.  He has had a recent increase in  both the frequency and severity of his chest pain.  He came to Dr.  Maree Krause office on Monday with these complaints.  He was sent urgently for  cardiology consultation and was found to have a markedly abnormal EKG,  compared to an EKG from a year ago.  He was admitted and underwent  cardiac catheterization, where he was found to have severe three-vessel  coronary disease with elevated end-diastolic pressure. Systolic function  was reasonably well preserved with an ejection fraction of 50%.  He did  have some chest discomfort last night which responded quickly to  nitroglycerin.   PAST MEDICAL HISTORY:  Significant for remote tobacco abuse, he quit 10  years ago, obstructive sleep apnea, hypercholesterolemia and  hypertension.   CURRENT MEDICATIONS:  His medications prior to admission were Ziac  10/6.5 one tablet daily, Tricor 145 mg daily, aspirin 81 mg daily,  hydrochlorothiazide 12.5 mg daily, Crestor 5 mg daily.   ALLERGIES:  He has no known drug allergies.   FAMILY HISTORY:  His father died of coronary disease at a relatively  young age.   SOCIAL HISTORY:  The patient is retired and still works part-time as a  Research scientist (medical).  He plays golf frequently.  He did smoke heavily, about two  packs a day  but quit in 1997.  He uses alcohol socially, but no heavy  use.   REVIEW OF SYSTEMS:  Obstructive sleep apnea, for which he uses CPAP.  He  denies peripheral edema, orthopnea or paroxysmal nocturnal dyspnea.  No  stroke or TIA symptoms.  No history of bleeding or bruising easily or  abnormally, no difficulty or recent change with his bowel or bladder  habits.  All other systems are negative.   PHYSICAL EXAMINATION:  APPEARANCE:  Scott Krause is an obese 70 year old  white male in no acute distress.  VITAL SIGNS:  Blood pressure 127/41, pulse is 50 and regular,  respirations are 16, oxygen saturation is 94% on 2 liters nasal cannula.  GENERAL:  He is overweight, but well developed.  NEUROLOGICAL:  He is alert, oriented x3, appropriate and grossly intact.  HEENT:  Exam is unremarkable.  Neck is supple without thyromegaly,  adenopathy. I did not hear any bruits, but it is very difficult to  auscultate his carotids.  CARDIAC:  Exam has regular rate and rhythm.  Normal S1-S2.  There is a 1-  2/6 systolic murmur.  LUNGS:  Clear.  ABDOMEN:  Obese, soft, nontender.  EXTREMITIES:  Without clubbing, cyanosis or edema.  He does have  palpable pulses throughout, which are equal bilaterally.  Skin is warm  and dry.   LABORATORY DATA:  EKG shows ST and T-wave changes with possible lateral  ischemia.  His CK was 100, MB was 2.0 with a troponin of 0.03, sodium  139, potassium 3.6, BUN and creatinine 22 and 1.1., glucose was 108.  White count was 12.1, hematocrit 42, platelets 227.  TSH is 1.68, PTT is  26, PT is 13.0, BNP is 195.   IMPRESSION:  Scott Krause is a 70 year old gentleman with multiple  cardiac risk factors.  He now presents with progressive angina.  At  catheterization, he had severe three-vessel coronary disease, along with  diastolic heart failure with elevated BMP and end-diastolic pressure.  Systolic function is preserved.  Coronary artery bypass graft is  indicated for survival  benefit, as well as relief of symptoms.  I have  discussed in detail with Scott Krause the nature and extent of the  operation, including the indications, risks, benefits and alternatives.  He understands the need for general anesthesia and general conduct of  the operation.  He understands the risks include, but are not limited  to, death, stroke, MI, DVT, PE, bleeding, possible need for transfusion,  infection, as well other organ system dysfunction, including  respiratory, renal, lymphatic or GI complications.  He understands and  accepts these risks and agrees to proceed.  We also discussed the  general recuperative times, including in-hospital stay, as well overall  recuperation.  He understands and accepts these risks and agrees to  proceed.  We will plan to proceed with surgery on Friday morning as the  first case.      Salvatore Decent Dorris Fetch, M.D.  Electronically Signed     SCH/MEDQ  D:  03/08/2007  T:  03/08/2007  Job:  [id number]   cc:   Thereasa Solo. Little, M.D.  Cristy Hilts. Jacinto Halim, MD  Robyne Peers

## 2011-02-16 NOTE — Letter (Signed)
June 19, 2007    Rivka Safer  [facility]  [address]  [place], [place] [address]   RE:  Scott Krause, Scott Krause  MRN:  [id number]  /  DOB:  September 06, 1941   Dear Lupita Leash:   I hope you got home safely from [facility] finally.  I was trying to call  you today about Mr. Raby who showed up today.  He is status post  pacemaker implantation following heart surgery in June.  He had atrial  fibrillation with a post termination pause.  He has lost 35 pounds, but  he still finds that he is quite limited.  At [facility], he can only  get his heart rate up to about 70.  He has also had problems with  dizziness.  This may be related to some of his medications.   MEDICATIONS:  1. Metoprolol 50 t.i.d.  2. Digoxin 0.25.  3. Zetia.  4. TriCor.  5. Ramipril 10.  6. Furosemide 40.  7. Amlodipine now at 5 from 10.   PHYSICAL EXAMINATION:  VITAL SIGNS:  His blood pressure is 132/60.  His  pulse is 60.  LUNGS:  Clear.  HEART:  Sounds regular.  There is an early systolic murmur.  ABDOMEN:  Soft but protuberant.  EXTREMITIES:  No edema.  SKIN:  Warm and dry.   Interrogation of his Medtronic Adapta Pulse generator demonstrates a P  wave of 2, impedance of 593, a threshold of 0.75 at 0.4.  The R waves  22, impedance 639, threshold 1.25 at 0.4.  He is chronotropically  incompetent at this point with 98% of his beats paced at 60 beats per  minute or less.   IMPRESSION:  1. Atrial fibrillation, quiescent.  2. Sinus bradycardia, question medication related to intrinsic.  3. Ischemic heart disease, status post bypass surgery with near normal      left ventricular function.  4. Obstructive sleep apnea.  5. Significant obesity with  35 pounds weight loss.  6. Hypertension.  7. Dizziness.   Lupita Leash, Mr. Tickner is doing much better with his weight loss.  I am  concerned about his chronotropic incompetence and we activated his rate  response today.  It may well be  that this could be further modulated by  down titration of his beta-blocker.   In addition, I am concerned whether his dizziness is from blood pressure  or from inadequate heart rate response to activity; in the interim since  you are on the plane from [place], I suggested that he discontinue his  amlodipine at 5 mg a day and see how he does.  He is to follow up with  you.  We will plan to see him again in about 3 months to assess  chronotropic response based on histograms.   Thanks so much for allowing [place] to participate in his care.    Sincerely,      Duke Salvia, MD, [facility]  Electronically Signed    SCK/MedQ  DD: 06/19/2007  DT: 06/19/2007  Job #: [id number]

## 2011-02-16 NOTE — Assessment & Plan Note (Signed)
OFFICE VISIT   Scott Krause, Scott Krause  DOB:  May 11, 1941                                        May 02, 2007  CHART #:  [id number]   Patient is a 70 year old gentleman who had coronary artery bypass  grafting x4 on June 6th.  He was seen in the office on July 15th, at  which time he was doing very well.  The only issue he was having at that  time was that he had a moderately enlarged left pleural effusion.  Thoracentesis was done, and 14 cc of fluid was evacuated.  He was given  a low dose prednisone taper and now returns for followup.  He states  that his breathing capacity improved markedly after the procedure.  About three days after the procedure, he had increased his walking  distance by three times, and he has not had any return of shortness of  breath.  He started cardiac rehab, and he is anxious just to get back to  his usual activities, including playing golf.   PHYSICAL EXAMINATION:  Patient is a 70 year old white male in no acute  distress.  His blood pressure is 156/63, pulse 58, respirations 20,  oxygen saturation 92% on room air.  Lungs are clear with equal breath  sounds bilaterally.  His cardiac has a regular rate and rhythm, normal  S1 and S2.  No murmurs, rubs or gallops.  Sternum is stable.  External  incision is clean, dry, and intact.  Leg incision is healing well.   Chest x-ray shows resolution of the left pleural effusion.  There is no  evidence of recurrence.   IMPRESSION:  Patient is a 70 year old gentleman.  He is now about 7  weeks out for coronary artery bypass grafting and is doing extremely at  this point in time.  He was given advice on return to physical  activities.  He was encouraged to build up gradually as he returns to  his usual physical activities and just stick with the cardiac rehab.  He  also understands the importance of weight loss and lifestyle  modification.  He will continue to be followed by Dr. Jacinto Halim in  [place].  I will be happy to see him back at any time if I can be of  any further assistance with his care.   Salvatore Decent Dorris Fetch, M.D.  Electronically Signed   SCH/MEDQ  D:  05/02/2007  T:  05/03/2007  Job:  [id number]   cc:   Thereasa Solo. Little, M.D.  Dewaine Oats  [place] R. Jacinto Halim, MD

## 2011-02-16 NOTE — Op Note (Signed)
[name], [name] NO.:  [id number]   MEDICAL RECORD NO.:  [id number]          PATIENT TYPE:  INP   LOCATION:  2312                         FACILITY:  [facility]   PHYSICIAN:  Scott Krause, M.D.DATE OF BIRTH:  05-31-1941   DATE OF PROCEDURE:  03/10/2007  DATE OF DISCHARGE:                               OPERATIVE REPORT   PREOPERATIVE DIAGNOSIS:  Three-vessel coronary disease with progressive  angina.   POSTOPERATIVE DIAGNOSIS:  Three-vessel coronary disease with progressive  angina.   PROCEDURES:  1. Median sternotomy.  2. Extracorporeal circulation.  3. Coronary artery bypass grafting x4 (left internal mammary artery to      left anterior descending artery, saphenous vein graft first      diagonal, saphenous vein graft obtuse marginal #2, saphenous vein      graft to posterior descending).  4. Endoscopic vein harvest left leg.   SURGEON:  Scott Krause, M.D.   ASSISTANT:  Theda Belfast, PA   ANESTHESIA:  General.   FINDINGS:  OM-2 poor-quality target.  Posterior descending fair-quality,  diagonal and LAD good-quality.  Good-quality conduits.  Left ventricular  hypertrophy.  Vein from left leg good-quality.  Right leg explored.  Vein was small, not harvested.   CLINICAL NOTE:  Scott Krause is a 70 year old gentleman with about a 1-  year history of angina.  He has recently had progression of his symptoms  and was admitted with diastolic heart failure and unstable angina.  At  catheterization he had severe three-vessel coronary disease and was  referred for coronary artery bypass grafting.  The indications, risks,  benefits and alternatives were discussed in detail with the patient.  He  understood and accepted the risks and agreed to proceed.   OPERATIVE NOTE:  Scott Krause was brought to the preop holding area on  March 10, 2007.  There the anesthesia service under the direction of Dr.  Adonis Huguenin placed lines for monitoring  arterial, central venous and  pulmonary arterial pressure.  Intravenous antibiotics were administered.  The patient was taken to the operating room, anesthetized and intubated.  A Foley catheter was placed.  The chest, abdomen and legs were prepped  and draped in the usual fashion.   A median sternotomy was performed.  The left internal mammary artery was  harvested using standard technique.  Simultaneously an incision was made  in the medial aspect of the right leg, where the greater saphenous vein  was identified but was relatively small.  Vein was not harvested from  the right leg.  An incision was made on the medial aspect of the left  leg.  Greater saphenous vein was identified, was of good quality and was  harvested endoscopically from the groin to the midcalf.  The vein was of  good quality.   Five thousand units of heparin was administered during the vessel  harvest.  The remainder of the full heparin dose was given prior to  opening the pericardium.  The pericardium was opened and the ascending  aorta was inspected.  There was no evidence of atherosclerotic disease.  The aorta  was cannulated via concentric 2-0 Ethibond pledgeted  pursestring sutures.  A dual-stage venous cannula was placed via  pursestring suture in the right atrial appendage.  Cardiopulmonary  bypass was instituted and the patient was cooled to 32 degrees Celsius.  The coronary arteries were inspected and anastomotic sites were chosen.  The conduits were inspected and cut to length.  A foam pad was placed in  the pericardium to protect the left phrenic nerve.  A temperature probe  was placed in myocardial septum and a cardioplegia cannula was placed in  the ascending aorta.   The aorta was crossclamped.  Left ventricle was emptied via the aortic  root vent.  Cardiac arrest then was achieved with a combination of cold  antegrade blood cardioplegia and topical iced saline.  Cardioplegia 1500  mL was  administered.  There was a rapid diastolic arrest.  Myocardial  septal temperature fell to 10 degrees Celsius.  The following distal  anastomoses were performed:   First a reversed saphenous vein graft was placed end-to-side to the  posterior descending branch of the right coronary.  This vessel had  significant proximal disease as well as a tight lesion in the right  coronary.  It was a 1.5 mm vessel.  It was heavily diseased proximally  but a probe passed easily distally.  The vein graft was anastomosed end-  to-side with a running 7-0 Prolene suture.  There was good flow through  the graft.  Cardioplegia was administered.  There was good hemostasis.   Next a reversed saphenous vein graft was placed end-to-side to the first  diagonal branch of the LAD.  This was a 1.5 mL vessel proximally but  only a 1 probe passed distally.  It was of fair quality.  The vein was  anastomosed end-to-side with a running 7-0 Prolene suture.  Again there  was good flow through the graft.  Cardioplegia was administered.  There  was good hemostasis.   Next a reversed saphenous vein graft was placed end-to-side to obtuse  marginal #2.  This was a large, dominant lateral branch which was  totally occluded and had minimal filling by collaterals.  It was a poor-  quality vessel but a 1.5-mm probe did pass within the vessel.  The vein  graft was anastomosed end-to-side with a running 7-0 Prolene suture.  There was adequate flow through the graft.  Cardioplegia was  administered.  There was good hemostasis.   Next the left internal mammary artery was brought through a window in  the pericardium.  The distal end was beveled.  It was anastomosed end-to-  side to the distal LAD.  The LAD was a 1.5 mm thick-walled but otherwise  good-quality target.  The mammary was a 2 mm good-quality conduit.  The  anastomosis was performed with a running 8-0 Prolene suture.  At completion of the anastomosis, the bulldog clamp  was removed.  There was  bleeding.  It was apparent that a suture had torn through the mammary  artery near the toe of the anastomosis.  This was not felt to be  repairable; therefore, the bulldog clamp was replaced.  The anastomosis  was taken down, the mammary was recut and the anastomosis was  reperformed.  The bulldog clamp was removed.  There was immediate and  rapid septal rewarming.  The bulldog clamp was replaced.  The mammary  pedicle was tacked to the epicardial surface of the heart with 6-0  Prolene sutures.   Additional cardioplegia  was administered.  The vein grafts were cut to  length.  The proximal vein graft anastomoses were performed to 4.5 mm  punch aortotomies with running 6-0 Prolene sutures.  At the completion  of all proximal anastomoses, the patient was placed in Trendelenburg  position, lidocaine was administered.  The bulldog clamp was removed  from the left mammary artery.  The aortic root was de-aired and the  aortic crossclamp was removed.  The total crossclamp time was 89  minutes.   While the patient was being rewarmed, all proximal and distal  anastomoses were inspected for hemostasis.  Epicardial pacing wires were  placed on the right ventricle and right atrium and the patient was  rewarmed to a core temperature of 37 degrees Celsius.  He was weaned  from cardiopulmonary bypass on the first attempt without difficulty.  The total bypass time was 124 minutes.   The initial cardiac index greater than 2 L/min. per sq. m.  The patient  remained hemodynamically stable throughout the post bypass period.  A  test dose of protamine was administered and was well-tolerated.  The  atrial and aortic cannulae were removed.  The remainder of the  protamine was administered without incident.  The chest was irrigated  with 1 L of warm normal saline containing 1 g of vancomycin.  Hemostasis  was achieved.  A left pleural and two mediastinal chest tubes were  placed  through separate subcostal incisions.  The pericardium was  reapproximated with interrupted 3-0 silk sutures.  It came together  easily without tension.  The sternum was closed with a combination of  single and double heavy-gauge stainless steel wires.  The pectoralis  fascia, subcutaneous tissue and skin were closed in standard fashion.  All sponge, needle and instrument counts were correct at the end of the  procedure.  The patient was taken from the operating room to the  surgical intensive care unit in fair condition.      Scott Decent Dorris Krause, M.D.  Electronically Signed     SCH/MEDQ  D:  03/10/2007  T:  03/11/2007  Job:  [id number]   cc:   Thereasa Solo. Little, M.D.  Dewaine Oats  [place] R. Jacinto Halim, MD

## 2011-02-16 NOTE — Cardiovascular Report (Signed)
NAME:  Scott Krause, Scott Krause NO.:  [id number]   MEDICAL RECORD NO.:  [id number]          PATIENT TYPE:  INP   LOCATION:  3703                         FACILITY:  [facility]   PHYSICIAN:  Nanetta Batty, M.D.   DATE OF BIRTH:  Feb 06, 1941   DATE OF PROCEDURE:  11/29/2008  DATE OF DISCHARGE:                            CARDIAC CATHETERIZATION   Scott Krause is a 70 year old mildly overweight white male with history  of CAD, status post CABG in June 2008, the LIMA to his LAD, vein graft  to an OM, diag, and PDA.  He also has had a prior transvenous pacemaker  placed for postop AFib with post-termination pauses.  He has obstructive  sleep apnea, hypertension, hyperlipidemia as well as right hip  replacement.  He apparently was cath'd after his bypass and was totally  one graft occluded.  His EF in the past has been estimated at 25-35%.  He was admitted last night with decreased exercise capacity, dizziness,  and increased dyspnea on exertion over the last week.  Symptoms were  negative.   PROCEDURE DESCRIPTION:  The patient was brought to the second floor,  [facility] in the postabsorptive state.  He is  premedicated with p.o. Valium.  His right groin was prepped and shaved  in the usual sterile fashion.  Xylocaine 1% was used for local  anesthesia.  A 6-French sheath was inserted into the right femoral  artery using standard Seldinger technique.  A 7-French sheath was  inserted into the right femoral vein.  A 7-French balloon-tipped  thermodilution Swan-Ganz catheter was used to obtain sequential right  heart pressures.  A 6-French right and left Judkins diagnostic catheter  as well as 6-French pigtail catheter were used for selective  cholangiography, left ventriculography, selective vein graft  angiography, and selective IMA angiography.  Visipaque dye was used for  the entirety of the case.  Retrograde aortic, left ventricular, and  pullback pressures were  recorded.   HEMODYNAMICS:  1. Aortic systolic pressure 152, diastolic pressure is 70.  2. Ventricle systolic pressure 162, and end-diastolic pressure 30.  3. RA pressure; A-wave 16, V-wave 20, and mean 13.  4. RV; Systolic 44, end-diastolic pressure is 17.  5. PA pressure; systolic 44, diastolic pressure 19, and mean 26.  6. Pulmonary capillary wedge pressure A-wave 26, V-wave 23, and mean      of 20.   SELECTIVE CORONARY ANGIOGRAPHY:  1. Left main; left main had a 40% tapered distal stenosis.  2. LAD; LAD had 80% proximal stenosis before the first diagonal branch      followed by total occlusion after the first septal perforator.  3. Left circumflex; left circumflex gave off a large ramus branch as a      high marginal that was free of significant disease.  Just beyond      this, there was a 60% segmental stenosis.  There is a 99%      subocclusive lesion with distal circumflex after small OM2 and      prior OM3 with a tandem 95% stenosis and minimal flow beyond.  4. Right coronary artery; the right coronary artery was dominant and      had a 50% proximal followed by 75% mid and 95% distal stenosis at      the crux.  The ostium in the PDA was not visible.  There is an 80%      stenosis in the PLA branch.  5. Vein graft to the PDA widely patent.  6. Vein graft to the diag widely patent.  7. LIMA to the LAD widely patent.  8. Vein graft to the circ OM occluded at the aorta.  9. Left ventriculography; RAO, left ventriculogram was performed using      25 mL of Visipaque dye at 12 mL per second.  The overall LVEF was      estimated at approximately 55-60% without focal wall motion      abnormalities.   IMPRESSION:  Scott Krause has anatomy unchanged from his prior cath by  history; however, his left ventricular function has improved.  His  filling pressures are mildly elevated and thus adjustment of his  medications including addition of oral nitrates and adjustments of  diuretics may  be helpful.  He will also need a outpatient Myoview to  assess for ischemia in the circumflex territory.   An ACT was measured and the sheath was removed.  Pressure was held on  the groin to achieve hemostasis.  The patient left the lab in stable  condition.      Nanetta Batty, M.D.  Electronically Signed     JB/MEDQ  D:  11/29/2008  T:  11/29/2008  Job:  [id number]   cc:   Scott Krause Cardiac Catheterization Lab Second Floor  [facility]  Scott Iba, MD

## 2011-02-16 NOTE — Assessment & Plan Note (Signed)
OFFICE VISIT   EBBIE, CHERRY  DOB:  09/04/1941                                        April 18, 2007  CHART #:  [id number]   HPI:  Mr. Lemmerman is a 70 year old gentleman, who underwent coronary  artery bypass grafting times four on June 6.  He had three-vessel  disease with progressive angina.  Postoperatively, he had ventricular  tachycardia and was seen in consultation by Dr. Graciela Husbands and noted to have  left bundle branch block with an inferior axis morphology, consistent  with a right ventricular outflow tract mechanism for the ventricular  tachycardia.  He also had postoperative atrial fibrillation and flutter  with post-termination pauses.  A pacemaker was placed by Dr. Graciela Husbands on  June 16.  He was started on Coumadin initially, but this was  discontinued after the pacemaker was placed.  The patient was ultimately  discharged home on June 18.   Mr. Beza returns today and states that since discharge he has been  doing well.  When he asked, he did not complain of any difficulty with  breathing.  He has had no incisional pain whatsoever.  He does still  feel a little weak and does not have all of his energy back yet, but is  anxious to begin increasing his activities and to begin driving.   PHYSICAL EXAMINATION:  Mr. Junkin is a 70 year old gentleman, in no  acute distress.  His blood pressure is 135/64, pulse 64, respirations  are 18, his oxygen saturation is 92% on room air.  His lungs have  markedly diminished breath sounds and dullness at the left base,  otherwise clear.  His cardiac exam has a regular rate and rhythm, normal  S1 and S2.  No rubs, murmurs or gallops.  The sternum is stable.  Sternal incision is clean, dry and intact.  He has no significant  peripheral edema.  His leg wound is well-healed.   Medication changes since discharge are Digoxin 0.125 mg p.o. daily,  Lasix 20 mg daily.  Norvasc and amiodarone have been discontinued.   LABORATORY DATA:  Chest x-ray shows a moderate left pleural effusion.   IMPRESSION:  Mr. Bogue is a 70 year old gentleman, who had coronary  bypass grafting in June of this year.  His postoperative course was  complicated by arrhythmias, ultimately requiring pacemaker placement and  amiodarone.  His amiodarone subsequently has been discontinued.  He now  has a moderately large left pleural effusion.  I discussed this with Mr.  Mittelstaedt and felt that he is much more likely to have a good result and  quicker return of pulmonary function if we proceed with thoracentesis  today, rather than trying to treat him with diuretics and steroids.  I  suspect he will require thoracentesis ultimately anyway.  I did discuss  with him the risks of the procedure, including bleeding and  pneumothorax.   Otherwise, Mr. Dollens is doing well at this point in time.  He has not  had any syncopal spells, any arrhythmias that he is aware of.  He is  having no incisional pain and his exercise tolerance is good and  improving.  We will proceed with a left thoracentesis today.   PROCEDURE NOTE:  Left thoracentesis was performed, using sterile  technique and local anesthesia, and 1400 mL of serous fluid was  evacuated.  The patient tolerated this well with minimal light-  headedness and minimal coughing at the end of the procedure, which  resolved within minutes.  He will be sent for a chest x-ray today and I  gave him a prescription for prednisone taper and I will see him back in  two weeks with a repeat chest x-ray.   Salvatore Decent Dorris Fetch, M.D.  Electronically Signed   SCH/MEDQ  D:  04/18/2007  T:  04/19/2007  Job:  [id number]   cc:   Thereasa Solo. Little, M.D.  Dewaine Oats  [place] R. Jacinto Halim, MD

## 2011-02-16 NOTE — Assessment & Plan Note (Signed)
[facility]                         ELECTROPHYSIOLOGY OFFICE NOTE   Scott Krause, Scott Krause                       MRN:          [id number]  DATE:04/12/2007                            DOB:          1941/06/29    HISTORY:  [name] was seen in the Device Clinic today for followup  of his newly-implanted Medtronic Adaptive pacemaker.  His device was  implanted on March 20, 2007, for atrial fibrillation with post-  termination pauses, status post coronary artery bypass graft surgery.  Interrogation of his device demonstrates P-waves of 1.4 to 2.0 mV with  an atrial impedance of 623 ohm's and a threshold of 0.5 volts at 0.4  msec.  His R-wave measured 15.68 to 22.4 mV with an RV impedance of 611  ohm's and a threshold of 0.75 volts at 0.4 msec.  His battery voltage  was 2.78 volts.  His underlying rhythm today was sinus bradycardia in  the 40's.  He had not had any mode switch or ventricular high-rate  episodes.  He is programmed DDD with a low rate of 60 and a upper rate  of 130, with a paced and sensed AV delay of 300 msec.  Search AV is on.  He is currently A-pacing 94% of the time and V-pacing less than 0.1% of  the time.   [name] Steri-Strips had already been removed.  His wound was  without redness or swelling.   FOLLOWUP:  He will return to the clinic in three months with Dr. Duke Salvia in the [facility].      Gypsy Balsam, RN,BSN  Electronically Signed      Duke Salvia, MD, [facility]  Electronically Signed   AS/MedQ  DD: 04/12/2007  DT: 04/12/2007  Job #: [id number]

## 2011-02-16 NOTE — Discharge Summary (Signed)
NAMEWINTER, TREFZ NO.:  [id number]   MEDICAL RECORD NO.:  [id number]          PATIENT TYPE:  INP   LOCATION:  3703                         FACILITY:  [facility]   PHYSICIAN:  Ritta Slot, MD     DATE OF BIRTH:  Nov 06, 1940   DATE OF ADMISSION:  11/28/2008  DATE OF DISCHARGE:  12/01/2008                               DISCHARGE SUMMARY   HISTORY OF PRESENT ILLNESS:  Scott Krause is a 70 year old male with  known coronary artery disease.  He is status post CABG in June 2008,  negative stress test in July 2009.  He had a repeat cath at [facility] in the last year, one bypass was supposedly blocked.  He came  to the emergency room because of his decreased exertional capacity, near  syncopal episode, and thus he was admitted for further cardiac  evaluation.  It was decided that he should undergo cardiac  catheterization.  He underwent right and left heart cath on November 29, 2008 by Dr. Nanetta Batty.  He was found to have an occluded left  circumflex to his OM, SVG with high-grade left circumflex and OM  disease, and remainder of the grafts were patent which includes a SVG to  his PDA and LIMA to his LAD and SVG to his diagonal.  His PCWP was 20.  His EDP was 30.  He had some problems with fluid.  Post-procedure, he  was given 40 mg of IV Lasix.  That following evening, he also could not  tolerate any fluids, so they were cut down to keep vein open.  The  following day, he was seen by Dr. Lynnea Ferrier.  He was apparently changed  from Lasix to Bumex.  His metoprolol was also decreased to 75 mg b.i.d.  On December 01, 2008, he was seen by Dr. Lynnea Ferrier and considered stable  for discharge home.  His blood pressure was 128/67, his room air sats  were 95%, his heart rate was 66, and his respirations were 20.   LABORATORY DATA:  Sodium 138, potassium 3.9, chloride 101, CO2 30,  glucose 95, BUN 22, and creatinine 1.10.  Hemoglobin 13.2, hematocrit  37.5, WBCs 9.9, and  platelets 181.  CK-MBs and troponins were negative.  His total cholesterol was 121, triglycerides 127, HDL was 23, and LDL  was 73.  His D-dimer was 0.34.  TSH was 1.817.  BNP was 61.  Chest x-ray  showed no active cardiopulmonary disease on November 28, 2008.   DISCHARGE DIAGNOSES:  1. Chest pain/near syncope, positive for aggressive coronary artery      disease but chest x-ray appeared to be unchanged from last      catheterization.  However, Dr. Allyson Sabal did recommend outpatient      Myoview for his left circumflex territory ischemia.  2. Acute on chronic congestive heart failure, diastolic with a normal      left ventricular function.  This was post catheterization.  3. History of ischemic cardiomyopathy with an ejection fraction of 25-      30%.  This cardiac catheterization showed ejection fraction was  normal.  4. Permanent pacemaker.  5. Hypertension.  6. Hyperlipidemia.  7. Obstructive sleep apnea with use of CPAP.      Lezlie Octave, N.P.      Ritta Slot, MD  Electronically Signed    BB/MEDQ  D:  12/01/2008  T:  12/01/2008  Job:  [id number]
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
## 2011-02-19 NOTE — Op Note (Signed)
[name], Scott NO.:  [id number]   MEDICAL RECORD NO.:  [id number]          PATIENT TYPE:  INP   LOCATION:  2015                         FACILITY:  [facility]   PHYSICIAN:  Duke Salvia, MD, FACCDATE OF BIRTH:  September 28, 1941   DATE OF PROCEDURE:  06/22/2007  DATE OF DISCHARGE:  03/23/2007                               OPERATIVE REPORT   PREOPERATIVE DIAGNOSIS:  Post heart surgery atrial fibrillation with  post termination pauses.   POSTOPERATIVE DIAGNOSIS:  Post heart surgery atrial fibrillation with  post termination pauses.   PROCEDURE:  Dual-chamber pacemaker implantation.   Following obtaining informed consent, the patient was brought to the  electrophysiology laboratory and placed on the fluoroscopic table in  supine position.  After routine prep and drape of the left chest,  lidocaine was infiltrated in the prepectoral subclavicular region.  Incision was made and carried down to the layer of the prepectoral  fascia using electrocautery and sharp dissection.  A pocket was formed  similarly.  Hemostasis was obtained.   Thereafter attention was turned to access the extrathoracic left  subclavian vein which was accomplished presumably without difficulty  without the aspiration or air or puncture of the artery.  Two separate  venipunctures were accomplished.  Guidewires were placed and retained  and sequentially 7-French sheath were placed through which were passed a  Medtronic 5076 58-cm active fixation ventricular lead, serial number  [id number]; this lead was marked with tie and atrial lead Medtronic 5076  52 cm in length, serial number [id number].  Under fluoroscopic guidance  these were manipulated to the right ventricle the right atrium  respectively where bipolar R wave was 11.4 with pace impedance 835 ohms  and threshold 0.7 volts and 0.5 milliseconds.  Current threshold 1.0 ma  and there is no diaphragmatic pacing at 10 volts.  The bipolar P-wave  was 4.8 mV with pace impedance of 934 ohms, a threshold of 2.1 volts at  0.5 milliseconds.  The current at threshold 3.7 ma.  There is no  diaphragmatic pacing at 10 volts.  Current of injury was brisk.   These acceptable parameters recorded, the leads were secured to the  prepectoral fascia and then attached to a Medtronic Adaptic DDDR01 pulse  generator serial number [id number].  P synchronous pacing was  identified.  The pocket was copiously irrigated with antibiotic  containing saline solution.  Hemostasis was assured.  The leads and  pulse generator were placed in the pocket secured to the prepectoral  fascia.  The wound was closed in three  layers in normal fashion.  Wound was washed, dried and a benzoin Steri-  Strip dressing was applied.  Needle counts, sponge counts and instrument  counts were correct at the end of procedure according to staff.  The  patient tolerated the procedure without apparent complication.      Duke Salvia, MD, [facility]  Electronically Signed     SCK/MEDQ  D:  06/22/2007  T:  06/22/2007  Job:  [id number]   cc:   Cristy Hilts. Jacinto Halim, MD  electrophys lab  [place] pacemaker cl  [facility]  heart &vasc pacemaker cl

## 2011-03-11 ENCOUNTER — Ambulatory Visit (INDEPENDENT_AMBULATORY_CARE_PROVIDER_SITE_OTHER): Payer: Medicare Other | Admitting: Internal Medicine

## 2011-03-11 ENCOUNTER — Encounter: Payer: Self-pay | Admitting: Internal Medicine

## 2011-03-11 DIAGNOSIS — I472 Ventricular tachycardia, unspecified: Secondary | ICD-10-CM

## 2011-03-11 DIAGNOSIS — I5032 Chronic diastolic (congestive) heart failure: Secondary | ICD-10-CM

## 2011-03-11 DIAGNOSIS — Z950 Presence of cardiac pacemaker: Secondary | ICD-10-CM

## 2011-03-11 DIAGNOSIS — I509 Heart failure, unspecified: Secondary | ICD-10-CM

## 2011-03-11 DIAGNOSIS — I495 Sick sinus syndrome: Secondary | ICD-10-CM

## 2011-03-11 NOTE — Progress Notes (Signed)
HPI Mr. Mccleery returns today for followup. He is a 70 year old man with a history symptomatic bradycardia status post pacemaker insertion. He has rare palpitations. He denies chest pain, shortness of breath, but does have mild peripheral edema. He is exercising regularly and has lost 30 pounds. He does have problems with arthritis. No Known Allergies   Current Outpatient Prescriptions  Medication Sig Dispense Refill  . amLODipine (NORVASC) 10 MG tablet Take 5 mg by mouth daily.       Marland Kitchen aspirin 81 MG tablet Take 81 mg by mouth daily.        Marland Kitchen atorvastatin (LIPITOR) 40 MG tablet Take 0.5 tablets (20 mg total) by mouth daily.  30 tablet  6  . bumetanide (BUMEX) 0.5 MG tablet Take 0.5 mg by mouth daily.        . furosemide (LASIX) 40 MG tablet Take 1 tablet (40 mg total) by mouth daily.  30 tablet  6  . metoprolol (LOPRESSOR) 50 MG tablet Take 50 mg by mouth. Take one and half tablet by mouth twice a day       . Multiple Vitamin (MULTI-VITAMIN PO) Take by mouth daily.        . Omega-3 Fatty Acids (FISH OIL) 1000 MG CAPS Take by mouth. Take two tablet by mouth twice a day       . pantoprazole (PROTONIX) 40 MG tablet Take 40 mg by mouth daily.        . ramipril (ALTACE) 10 MG capsule Take 1 capsule (10 mg total) by mouth daily.  30 capsule  6  . traZODone (DESYREL) 50 MG tablet Take 50 mg by mouth at bedtime.        Marland Kitchen DISCONTD: Mometasone Furo-Formoterol Fum (DULERA) 100-5 MCG/ACT AERO Inhale into the lungs. As directed       . DISCONTD: tiotropium (SPIRIVA) 18 MCG inhalation capsule Place 18 mcg into inhaler and inhale daily.           Past Medical History  Diagnosis Date  . Morbid obesity   . CAD (coronary artery disease)   . COPD (chronic obstructive pulmonary disease)   . Cardiomyopathy     Ischemic  . Pacemaker   . Hypertension   . Hyperlipidemia   . Obstructive sleep apnea     ROS:   All systems reviewed and negative except as noted in the HPI.   Past Surgical History    Procedure Date  . Coronary artery bypass graft   . Right hip replacement   . Nerve repair in left arm   . Cardiac pacemaker placement     PPM  Medtronic     No family history on file.   History   Social History  . Marital Status: Widowed    Spouse Name: N/A    Number of Children: N/A  . Years of Education: N/A   Occupational History  . Not on file.   Social History Main Topics  . Smoking status: Former Smoker -- 1.0 packs/day for 40 years    Types: Cigarettes    Quit date: 12/22/1995  . Smokeless tobacco: Former Neurosurgeon  . Alcohol Use: Yes     ocassional  . Drug Use: No  . Sexually Active: Not on file   Other Topics Concern  . Not on file   Social History Narrative  . No narrative on file     BP 126/70  Pulse 68  Ht 5\' 8"  (1.727 m)  Wt 271 lb 1.9 oz (  122.979 kg)  BMI 41.22 kg/m2  Physical Exam:  Well appearing NAD HEENT: Unremarkable Neck:  No JVD, no thyromegally Lymphatics:  No adenopathy Back:  No CVA tenderness Lungs:  Clear. No wheezes, rales, or rhonchi. Well-healed pacemaker incision. HEART:  Regular rate rhythm, no murmurs, no rubs, no clicks Abd:  Flat, positive bowel sounds, no organomegally, no rebound, no guarding Ext:  2 plus pulses, no edema, no cyanosis, no clubbing Skin:  No rashes no nodules Neuro:  CN II through XII intact, motor grossly intact  DEVICE  Normal device function.  See PaceArt for details.   Assess/Plan:

## 2011-03-11 NOTE — Assessment & Plan Note (Signed)
His symptoms are currently class I. He is instructed to continue his medications and maintain a low-sodium diet. He is exercising regularly without limitation

## 2011-03-11 NOTE — Assessment & Plan Note (Signed)
The patient is asymptomatic. He does not experience palpitations. If the patient begins to have symptoms I have asked him to contact us. For now, no change in medications.

## 2011-03-11 NOTE — Assessment & Plan Note (Signed)
His device is working normally. Will recheck in several months. 

## 2011-03-11 NOTE — Patient Instructions (Addendum)
Your physician recommends that you schedule a follow-up appointment in: send in remote transmission 06/10/11 Your physician recommends that you schedule a follow-up appointment in: 1 year with Dr. Ladona Ridgel

## 2011-03-25 ENCOUNTER — Encounter: Payer: Self-pay | Admitting: Internal Medicine

## 2011-03-25 ENCOUNTER — Telehealth: Payer: Self-pay | Admitting: *Deleted

## 2011-03-25 NOTE — Telephone Encounter (Signed)
Pt called this AM stating he felt "extra beats" and he has h/o vtach, s/p PPM in 2009. Pt c/o incr in weakness only, no dizziness, CP, or SOB. Pt last seen in office by Dr. Ladona Ridgel 03/08/11, no problems at that time. Pt also see Dr. Mariah Milling. Notified [facility] clinic of pt symptoms, and pt is going to download device now and [facility] clinic will call with any abnormal rhythm. If pt's symptoms persist, we may discuss ordering an event monitor.

## 2011-03-25 NOTE — Telephone Encounter (Signed)
Spoke to pacer clinic in [place] after pt downloaded device. He had recorded 8 beat run of V-tach this AM. Also noted several episodes on 6/20 and 6/18 as well. Per the pacer clinic, they spoke to [place], [place], and pt was scheduled to see Dr. Ladona Ridgel next week 04/01/11, no med changes were made. I have advised pt to call 911 for any symptoms, or if he feels unstable. Pt states he will do this.

## 2011-03-26 DIAGNOSIS — I472 Ventricular tachycardia, unspecified: Secondary | ICD-10-CM

## 2011-03-26 DIAGNOSIS — R5383 Other fatigue: Secondary | ICD-10-CM

## 2011-03-26 DIAGNOSIS — I517 Cardiomegaly: Secondary | ICD-10-CM

## 2011-03-26 DIAGNOSIS — R55 Syncope and collapse: Secondary | ICD-10-CM

## 2011-03-26 DIAGNOSIS — R5381 Other malaise: Secondary | ICD-10-CM

## 2011-03-30 ENCOUNTER — Other Ambulatory Visit: Payer: Self-pay | Admitting: Emergency Medicine

## 2011-03-30 DIAGNOSIS — I472 Ventricular tachycardia, unspecified: Secondary | ICD-10-CM

## 2011-03-30 DIAGNOSIS — R55 Syncope and collapse: Secondary | ICD-10-CM

## 2011-03-30 MED ORDER — ATORVASTATIN CALCIUM 80 MG PO TABS: 80.0000 mg | ORAL_TABLET | Freq: Every day | ORAL | Status: DC

## 2011-03-31 ENCOUNTER — Encounter: Payer: Medicare Other | Admitting: Internal Medicine

## 2011-04-01 ENCOUNTER — Encounter: Payer: Medicare Other | Admitting: Internal Medicine

## 2011-04-09 ENCOUNTER — Encounter: Payer: Self-pay | Admitting: Cardiovascular Disease

## 2011-04-09 ENCOUNTER — Ambulatory Visit (INDEPENDENT_AMBULATORY_CARE_PROVIDER_SITE_OTHER): Payer: Medicare Other | Admitting: Cardiovascular Disease

## 2011-04-09 DIAGNOSIS — R5381 Other malaise: Secondary | ICD-10-CM

## 2011-04-09 DIAGNOSIS — N259 Disorder resulting from impaired renal tubular function, unspecified: Secondary | ICD-10-CM

## 2011-04-09 DIAGNOSIS — Z950 Presence of cardiac pacemaker: Secondary | ICD-10-CM

## 2011-04-09 DIAGNOSIS — I10 Essential (primary) hypertension: Secondary | ICD-10-CM

## 2011-04-09 DIAGNOSIS — I2589 Other forms of chronic ischemic heart disease: Secondary | ICD-10-CM

## 2011-04-09 DIAGNOSIS — J4489 Other specified chronic obstructive pulmonary disease: Secondary | ICD-10-CM

## 2011-04-09 DIAGNOSIS — R0989 Other specified symptoms and signs involving the circulatory and respiratory systems: Secondary | ICD-10-CM

## 2011-04-09 DIAGNOSIS — I509 Heart failure, unspecified: Secondary | ICD-10-CM

## 2011-04-09 DIAGNOSIS — E785 Hyperlipidemia, unspecified: Secondary | ICD-10-CM

## 2011-04-09 DIAGNOSIS — I2510 Atherosclerotic heart disease of native coronary artery without angina pectoris: Secondary | ICD-10-CM

## 2011-04-09 DIAGNOSIS — I472 Ventricular tachycardia, unspecified: Secondary | ICD-10-CM

## 2011-04-09 DIAGNOSIS — I5032 Chronic diastolic (congestive) heart failure: Secondary | ICD-10-CM

## 2011-04-09 DIAGNOSIS — J449 Chronic obstructive pulmonary disease, unspecified: Secondary | ICD-10-CM

## 2011-04-09 DIAGNOSIS — R0609 Other forms of dyspnea: Secondary | ICD-10-CM

## 2011-04-09 MED ORDER — LEVALBUTEROL HCL 1.25 MG/0.5ML IN NEBU: 1.0000 | INHALATION_SOLUTION | RESPIRATORY_TRACT | Status: DC | PRN

## 2011-04-09 MED ORDER — FLUTICASONE-SALMETEROL 250-50 MCG/DOSE IN AEPB: 1.0000 | INHALATION_SPRAY | Freq: Two times a day (BID) | RESPIRATORY_TRACT | Status: DC

## 2011-04-09 MED ORDER — AMLODIPINE BESYLATE 10 MG PO TABS: 10.0000 mg | ORAL_TABLET | Freq: Every day | ORAL | Status: DC

## 2011-04-09 NOTE — Patient Instructions (Addendum)
We have called in new inhalers for you We will increase the amlodipine to 5 mg daily,  You can increase it to 10 mg if needed for blood pressure Please call us if you have new issues that need to be addressed before your next appt.  Your physician recommends that you schedule a follow-up appointment in: 1 month

## 2011-04-10 NOTE — Assessment & Plan Note (Signed)
Very poor lung sounds on clinical exam. He denies significant shortness of breath. He does have some wheezing. He is not on inhalers. Smoking history for 55 years. We have suggested he start Xopenex and salmeterol inhalers.

## 2011-04-10 NOTE — Progress Notes (Signed)
Patient ID: ALDIN DREES, male    DOB: June 02, 1941, 70 y.o.   MRN: [id number]  HPI Comments: 70 year old gentleman with obesity, coronary artery disease, bypass surgery with severe native three-vessel disease, obstructive sleep apnea, left catheterization Feb 2010 showing patent grafts, with pacemaker for tachybrady syndrome,  who presents for routine follow up. He was recently admitted to the hospital for nonsustained VT and frequent PVCs, weakness and dizziness. Episode occurred while playing golf. He was in his car and felt lightheaded. He had more than one episode.   His metoprolol was increased to 75 mg b.i.d. And his nonsustained VT and PVCs improved.  He presents today and reports that he has not been feeling well. He reports that he has had pain in his legs feel weak. He has not been exercising as he normally does. He denies any significant shortness of breath. He does not use inhalers. He does report a smoking history for 55 years.  Stress test done in the hospital showed no large region of ischemia. There is a very small region of ischemia noted in the basal to mid inferolateral wall  Echocardiogram showed normal LV systolic function, diastolic dysfunction, otherwise no valve abnormalities  He reports that his blood pressure has been elevated at home. He missed his followup with Dr. Ladona Ridgel He continues to struggle with his weight.  He had pneumonia in January 2011 and was admitted to [facility] for antibiotics. He was diagnosed with upper GI bleed and had an outpatient EGD in February 2011. He was discharged initially on Avelox. His kidney function was abnormal on admission with a creatinine greater than 2 and BUN 54 consistent with dehydration. His lower extremity edema has been stable and he is cut back on his diuretic from two a day to one a day.  EKG shows Sinus rhythm with rate 99 beats per minute, numerous PVCs, nonspecific ST changes in inferior leads  Recent  cholesterol is 135, LDL 72, HDL 29, creatinine 1.13  Outpatient Encounter Prescriptions as of 04/09/2011  Medication Sig Dispense Refill  . allopurinol (ZYLOPRIM) 300 MG tablet Take 1 tablet by mouth daily.      Marland Kitchen aspirin 81 MG tablet Take 81 mg by mouth daily.        Marland Kitchen atorvastatin (LIPITOR) 80 MG tablet Take 40 mg by mouth daily.        Marland Kitchen COLCRYS 0.6 MG tablet Take 1 tablet by mouth daily.      . furosemide (LASIX) 40 MG tablet Take 1 tablet (40 mg total) by mouth daily.  30 tablet  6  . metoprolol (LOPRESSOR) 50 MG tablet Take 100 mg by mouth 2 (two) times daily.       . Multiple Vitamin (MULTI-VITAMIN PO) Take by mouth daily.        . Omega-3 Fatty Acids (FISH OIL) 1000 MG CAPS Take by mouth. Take two tablet by mouth twice a day       . omeprazole (PRILOSEC) 20 MG capsule Take 20 mg by mouth 2 (two) times daily.        . ramipril (ALTACE) 10 MG capsule Take 1 capsule (10 mg total) by mouth daily.  30 capsule  6  . amLODipine (NORVASC) 2.5 MG tablet Take 1 tablet (2.5 mg total) by mouth daily.  30 tablet  6     Review of Systems  Constitutional: Positive for fatigue.  HENT: Negative.   Eyes: Negative.   Cardiovascular: Negative.   Gastrointestinal: Negative.  Musculoskeletal: Negative.   Skin: Negative.   Neurological: Positive for weakness.  Hematological: Negative.   Psychiatric/Behavioral: Negative.   All other systems reviewed and are negative.    BP 150/83  Pulse 99  Ht 5\' 8"  (1.727 m)  Wt 270 lb (122.471 kg)  BMI 41.05 kg/m2   Physical Exam  Nursing note and vitals reviewed. Constitutional: He is oriented to person, place, and time. He appears well-developed and well-nourished.       Morbidly obese  HENT:  Head: Normocephalic.  Nose: Nose normal.  Mouth/Throat: Oropharynx is clear and moist.  Eyes: Conjunctivae are normal. Pupils are equal, round, and reactive to light.  Neck: Normal range of motion. Neck supple. No JVD present.  Cardiovascular: Normal rate,  regular rhythm, S1 normal, S2 normal, normal heart sounds and intact distal pulses.  Exam reveals no gallop and no friction rub.   No murmur heard. Pulmonary/Chest: Effort normal. No respiratory distress. He has wheezes. He has no rales. He exhibits no tenderness.       Decreased breath sounds throughout.  Abdominal: Soft. Bowel sounds are normal. He exhibits no distension. There is no tenderness.  Musculoskeletal: Normal range of motion. He exhibits no edema and no tenderness.  Lymphadenopathy:    He has no cervical adenopathy.  Neurological: He is alert and oriented to person, place, and time. Coordination normal.  Skin: Skin is warm and dry. No rash noted. No erythema.  Psychiatric: He has a normal mood and affect. His behavior is normal. Judgment and thought content normal.           Assessment and Plan

## 2011-04-10 NOTE — Assessment & Plan Note (Signed)
We'll continue his current statin dose. Recent negative stress test

## 2011-04-10 NOTE — Assessment & Plan Note (Signed)
No indication of lightheadedness or near syncope. His heart rate is mildly elevated today. Uncertain if this is from his severe underlying lung disease. We will continue his beta blocker at its current dose. It is certain it possible that a higher beta blocker dose would exacerbate underlying COPD.

## 2011-04-10 NOTE — Assessment & Plan Note (Signed)
Renal function in the hospital was essentially normal.

## 2011-04-10 NOTE — Assessment & Plan Note (Signed)
Blood pressure is elevated. We have suggested he increase the amlodipine to 5 mg daily. If his pressure continues to be high, he could increase the amlodipine to 10 mg daily.

## 2011-04-10 NOTE — Assessment & Plan Note (Signed)
We have encouraged continued exercise, careful diet management in an effort to lose weight. 

## 2011-04-10 NOTE — Assessment & Plan Note (Signed)
Etiology of the weakness in his legs could be secondary to recent sedentary period. We have suggested that he start exercising again once the weather improves and is less hot.

## 2011-04-10 NOTE — Assessment & Plan Note (Signed)
We have encouraged him to continue his diuretic. Also to take an extra diuretic as needed for shortness of breath.

## 2011-05-10 ENCOUNTER — Ambulatory Visit: Payer: Medicare Other | Admitting: Cardiovascular Disease

## 2011-05-13 ENCOUNTER — Encounter: Payer: Self-pay | Admitting: Cardiovascular Disease

## 2011-05-13 ENCOUNTER — Ambulatory Visit (INDEPENDENT_AMBULATORY_CARE_PROVIDER_SITE_OTHER): Payer: Medicare Other | Admitting: Cardiovascular Disease

## 2011-05-13 VITALS — BP 173/84 | HR 67 | Ht 68.0 in | Wt 281.0 lb

## 2011-05-13 DIAGNOSIS — R0989 Other specified symptoms and signs involving the circulatory and respiratory systems: Secondary | ICD-10-CM

## 2011-05-13 DIAGNOSIS — N259 Disorder resulting from impaired renal tubular function, unspecified: Secondary | ICD-10-CM

## 2011-05-13 DIAGNOSIS — I2510 Atherosclerotic heart disease of native coronary artery without angina pectoris: Secondary | ICD-10-CM

## 2011-05-13 DIAGNOSIS — I472 Ventricular tachycardia: Secondary | ICD-10-CM

## 2011-05-13 DIAGNOSIS — J449 Chronic obstructive pulmonary disease, unspecified: Secondary | ICD-10-CM

## 2011-05-13 DIAGNOSIS — I509 Heart failure, unspecified: Secondary | ICD-10-CM

## 2011-05-13 DIAGNOSIS — I5032 Chronic diastolic (congestive) heart failure: Secondary | ICD-10-CM

## 2011-05-13 DIAGNOSIS — E785 Hyperlipidemia, unspecified: Secondary | ICD-10-CM

## 2011-05-13 MED ORDER — FUROSEMIDE 40 MG PO TABS: 40.0000 mg | ORAL_TABLET | Freq: Two times a day (BID) | ORAL | Status: DC

## 2011-05-13 MED ORDER — TIOTROPIUM BROMIDE MONOHYDRATE 18 MCG IN CAPS: 18.0000 ug | ORAL_CAPSULE | Freq: Every day | RESPIRATORY_TRACT | Status: DC

## 2011-05-13 MED ORDER — RAMIPRIL 10 MG PO CAPS: 10.0000 mg | ORAL_CAPSULE | Freq: Two times a day (BID) | ORAL | Status: DC

## 2011-05-13 NOTE — Assessment & Plan Note (Signed)
Currently with no symptoms of angina. No further workup at this time. Continue current medication regimen. 

## 2011-05-13 NOTE — Assessment & Plan Note (Signed)
No indication of lightheadedness or near syncope.We will continue his beta blocker at its current dose.

## 2011-05-13 NOTE — Assessment & Plan Note (Signed)
He is feeling better on Spiriva as well as a second inhaler provided by Dr. Meredeth Ide. He walks and plays golf with periodic symptoms of shortness of breath. Often has good days.

## 2011-05-13 NOTE — Assessment & Plan Note (Signed)
Symptoms seem to come and go. Likely from obesity, deconditioning, underlying COPD as well as cardiac disease. Diastolic dysfunction with her weight gain improved with Lasix daily Amoxil times b.i.d.  Currently stable.

## 2011-05-13 NOTE — Assessment & Plan Note (Signed)
We will need to closely monitor his renal function acutely if he has any weight loss. History of renal insufficiency with overdiuresis.

## 2011-05-13 NOTE — Assessment & Plan Note (Signed)
We need to try to obtain his most recent cholesterol levels for her records. Previous cholesterol was at goal.

## 2011-05-13 NOTE — Assessment & Plan Note (Signed)
There was a concern that his recent weight gain was from worsening heart failure. Minimal edema on exam, and weight gain likely from overeating.

## 2011-05-13 NOTE — Progress Notes (Signed)
Patient ID: Scott Krause, male    DOB: [birth year], 70 y.o.   MRN: [id number]  HPI Comments: 70 year old gentleman with obesity, coronary artery disease, bypass surgery with severe native three-vessel disease, obstructive sleep apnea, left catheterization Feb 2010 showing patent grafts, with pacemaker for tachybrady syndrome, admitted to the hospital for nonsustained VT and frequent PVCs, weakness and dizziness that occurred while playing golf,  who presents for routine follow up. His weight measurements at home have been trending upward and show at least a 10 pound weight gain over the past month or so. Previous history of renal insufficiency with aggressive diuresis.  He reports that he has had periodic shortness of breath though takes an extra Lasix as needed with improvement of his symptoms. In general he takes Lasix once a day, sometimes twice a day. He has had very minimal edema in his legs. He reports blood pressures at home in the 140 range on a regular basis. The inhalers have helped. He takes Spiriva and as well has another inhaler from Dr. Meredeth Ide. He was unable to afford Xopenex nebulizer.  He denies any dizzy episodes. No further episodes of tachycardia palpitations or lightheadedness. He believes his heart rate is doing well on metoprolol 100 mg twice a day.  Weight Has been trending up, now on low carb diet. He reports the weight increases from "pizza".  He does report a smoking history for 55 years.  Stress test done in the hospital showed no large region of ischemia. There is a very small region of ischemia noted in the basal to mid inferolateral wall  Echocardiogram showed normal LV systolic function, diastolic dysfunction, otherwise no valve abnormalities  He had pneumonia in January 2011 and was admitted to [facility] for antibiotics. He was diagnosed with upper GI bleed and had an outpatient EGD in February 2011. He was discharged initially on Avelox. His kidney  function was abnormal on admission with a creatinine greater than 2 and BUN 54 consistent with dehydration.   EKG shows Sinus rhythm with rate 99 beats per minute, numerous PVCs, nonspecific ST changes in inferior leads  Recent cholesterol is 135, LDL 72, HDL 29, creatinine 1.13  Outpatient Encounter Prescriptions as of 04/09/2011  Medication Sig Dispense Refill  . allopurinol (ZYLOPRIM) 300 MG tablet Take 1 tablet by mouth daily.      Marland Kitchen aspirin 81 MG tablet Take 81 mg by mouth daily.        Marland Kitchen atorvastatin (LIPITOR) 80 MG tablet Take 40 mg by mouth daily.        Marland Kitchen COLCRYS 0.6 MG tablet Take 1 tablet by mouth daily.      . furosemide (LASIX) 40 MG tablet Take 1 tablet (40 mg total) by mouth daily.  30 tablet  6  . metoprolol (LOPRESSOR) 50 MG tablet Take 100 mg by mouth 2 (two) times daily.       . Multiple Vitamin (MULTI-VITAMIN PO) Take by mouth daily.        . Omega-3 Fatty Acids (FISH OIL) 1000 MG CAPS Take by mouth. Take two tablet by mouth twice a day       . omeprazole (PRILOSEC) 20 MG capsule Take 20 mg by mouth 2 (two) times daily.        . ramipril (ALTACE) 10 MG capsule Take 1 capsule (10 mg total) by mouth daily.  30 capsule  6  . amLODipine (NORVASC) 2.5 MG tablet Take 1 tablet (2.5 mg total) by mouth daily.  30 tablet  6     Review of Systems  HENT: Negative.   Eyes: Negative.   Respiratory: Positive for shortness of breath.   Cardiovascular: Negative.   Gastrointestinal: Negative.   Musculoskeletal: Negative.   Skin: Negative.   Hematological: Negative.   Psychiatric/Behavioral: Negative.   All other systems reviewed and are negative.    BP 173/84  Pulse 67  Ht 5\' 8"  (1.727 m)  Wt 281 lb (127.461 kg)  BMI 42.73 kg/m2 Repeat blood pressure is 140/85  Physical Exam  Nursing note and vitals reviewed. Constitutional: He is oriented to person, place, and time. He appears well-developed and well-nourished.       Morbidly obese  HENT:  Head: Normocephalic.  Nose:  Nose normal.  Mouth/Throat: Oropharynx is clear and moist.  Eyes: Conjunctivae are normal. Pupils are equal, round, and reactive to light.  Neck: Normal range of motion. Neck supple. No JVD present.  Cardiovascular: Normal rate, regular rhythm, S1 normal, S2 normal, normal heart sounds and intact distal pulses.  Exam reveals no gallop and no friction rub.   No murmur heard. Pulmonary/Chest: Effort normal. No respiratory distress. He has no wheezes. He has no rales. He exhibits no tenderness.       Decreased breath sounds throughout.  Abdominal: Soft. Bowel sounds are normal. He exhibits no distension. There is no tenderness.  Musculoskeletal: Normal range of motion. He exhibits no edema and no tenderness.  Lymphadenopathy:    He has no cervical adenopathy.  Neurological: He is alert and oriented to person, place, and time. Coordination normal.  Skin: Skin is warm and dry. No rash noted. No erythema.  Psychiatric: He has a normal mood and affect. His behavior is normal. Judgment and thought content normal.           Assessment and Plan

## 2011-05-13 NOTE — Patient Instructions (Addendum)
You are doing well. Please increase the ramipril to 10 mg twice a day Continue lasix once a day, take a extra as needed for SOB. Please monitor your weight.  Please call us if you have new issues that need to be addressed before your next appt.  We will call you for a follow up Appt. In 6 months

## 2011-05-14 ENCOUNTER — Telehealth: Payer: Self-pay | Admitting: *Deleted

## 2011-05-14 NOTE — Telephone Encounter (Signed)
Pt called to let Dr. Mariah Milling know what rescue inhaler he uses, he could not remember name at his ov yesterday. He uses Dulera 185mcg/5mcg PRN. Will add to his medlist.

## 2011-05-27 ENCOUNTER — Ambulatory Visit (INDEPENDENT_AMBULATORY_CARE_PROVIDER_SITE_OTHER): Payer: Medicare Other | Admitting: *Deleted

## 2011-05-27 DIAGNOSIS — I2510 Atherosclerotic heart disease of native coronary artery without angina pectoris: Secondary | ICD-10-CM

## 2011-05-27 DIAGNOSIS — Z79899 Other long term (current) drug therapy: Secondary | ICD-10-CM

## 2011-05-28 LAB — BASIC METABOLIC PANEL
Calcium: 8.9 mg/dL (ref 8.4–10.5)
Glucose, Bld: 105 mg/dL — ABNORMAL HIGH (ref 70–99)
Potassium: 4.3 mEq/L (ref 3.5–5.3)
Sodium: 142 mEq/L (ref 135–145)

## 2011-05-31 ENCOUNTER — Other Ambulatory Visit: Payer: Self-pay | Admitting: *Deleted

## 2011-05-31 MED ORDER — RAMIPRIL 10 MG PO CAPS: 10.0000 mg | ORAL_CAPSULE | Freq: Two times a day (BID) | ORAL | Status: DC

## 2011-05-31 NOTE — Telephone Encounter (Signed)
Pt is requesting new Rx for his Ramipril since Dr. Mariah Milling incr dosage to BID from Once daily. Will send pt's rx for bid, per last note.

## 2011-06-10 ENCOUNTER — Encounter: Payer: Medicare Other | Admitting: *Deleted

## 2011-06-17 ENCOUNTER — Encounter: Payer: Self-pay | Admitting: *Deleted

## 2011-06-20 ENCOUNTER — Other Ambulatory Visit: Payer: Self-pay | Admitting: Internal Medicine

## 2011-06-20 ENCOUNTER — Encounter: Payer: Self-pay | Admitting: Internal Medicine

## 2011-06-21 ENCOUNTER — Ambulatory Visit (INDEPENDENT_AMBULATORY_CARE_PROVIDER_SITE_OTHER): Payer: Medicare Other | Admitting: *Deleted

## 2011-06-21 DIAGNOSIS — I472 Ventricular tachycardia, unspecified: Secondary | ICD-10-CM

## 2011-06-21 DIAGNOSIS — Z950 Presence of cardiac pacemaker: Secondary | ICD-10-CM

## 2011-06-21 DIAGNOSIS — I4891 Unspecified atrial fibrillation: Secondary | ICD-10-CM

## 2011-06-21 DIAGNOSIS — I495 Sick sinus syndrome: Secondary | ICD-10-CM

## 2011-06-21 LAB — REMOTE PACEMAKER DEVICE
AL THRESHOLD: 0.75 V
BAMS-0001: 160 {beats}/min
BATTERY VOLTAGE: 2.77 V
RV LEAD AMPLITUDE: 22.4 mv
RV LEAD THRESHOLD: 1 V

## 2011-07-02 NOTE — Progress Notes (Signed)
Pacer checked by remote 

## 2011-07-07 ENCOUNTER — Encounter: Payer: Self-pay | Admitting: *Deleted

## 2011-07-21 LAB — PROTIME-INR
INR: 1.1
INR: 1.1
Prothrombin Time: 14.4

## 2011-07-21 LAB — CBC
MCV: 86.8
Platelets: 433 — ABNORMAL HIGH
RBC: 3.64 — ABNORMAL LOW
WBC: 14.7 — ABNORMAL HIGH

## 2011-07-21 LAB — BASIC METABOLIC PANEL
BUN: 39 — ABNORMAL HIGH
Calcium: 8.8
Creatinine, Ser: 1.33
GFR calc Af Amer: >60
GFR calc non Af Amer: 54 — ABNORMAL LOW

## 2011-07-22 LAB — BASIC METABOLIC PANEL
BUN: 18
BUN: 19
BUN: 21
BUN: 31 — ABNORMAL HIGH
BUN: 40 — ABNORMAL HIGH
BUN: 40 — ABNORMAL HIGH
BUN: 42 — ABNORMAL HIGH
CO2: 25
CO2: 29
CO2: 33 — ABNORMAL HIGH
CO2: 35 — ABNORMAL HIGH
CO2: 36 — ABNORMAL HIGH
CO2: 38 — ABNORMAL HIGH
Calcium: 7.7 — ABNORMAL LOW
Calcium: 7.9 — ABNORMAL LOW
Calcium: 8.1 — ABNORMAL LOW
Calcium: 8.1 — ABNORMAL LOW
Calcium: 8.2 — ABNORMAL LOW
Calcium: 8.3 — ABNORMAL LOW
Calcium: 9
Calcium: 9
Calcium: 9.1
Calcium: 9.3
Chloride: 100
Chloride: 104
Chloride: 111
Chloride: 98
Chloride: 98
Chloride: 99
Creatinine, Ser: 1.11
Creatinine, Ser: 1.15
Creatinine, Ser: 1.38
Creatinine, Ser: 1.4
Creatinine, Ser: 1.45
Creatinine, Ser: 1.46
Creatinine, Ser: 1.55 — ABNORMAL HIGH
Creatinine, Ser: 1.64 — ABNORMAL HIGH
Creatinine, Ser: 1.91 — ABNORMAL HIGH
GFR calc Af Amer: 43 — ABNORMAL LOW
GFR calc Af Amer: 51 — ABNORMAL LOW
GFR calc Af Amer: 55 — ABNORMAL LOW
GFR calc Af Amer: 55 — ABNORMAL LOW
GFR calc Af Amer: 59 — ABNORMAL LOW
GFR calc Af Amer: >60
GFR calc Af Amer: >60
GFR calc Af Amer: >60
GFR calc non Af Amer: 35 — ABNORMAL LOW
GFR calc non Af Amer: 46 — ABNORMAL LOW
GFR calc non Af Amer: 49 — ABNORMAL LOW
GFR calc non Af Amer: 52 — ABNORMAL LOW
GFR calc non Af Amer: >60
Glucose, Bld: 103 — ABNORMAL HIGH
Glucose, Bld: 105 — ABNORMAL HIGH
Glucose, Bld: 108 — ABNORMAL HIGH
Glucose, Bld: 109 — ABNORMAL HIGH
Glucose, Bld: 117 — ABNORMAL HIGH
Glucose, Bld: 131 — ABNORMAL HIGH
Potassium: 4.3
Potassium: 4.4
Potassium: 4.8
Sodium: 139
Sodium: 141
Sodium: 141
Sodium: 141
Sodium: 142

## 2011-07-22 LAB — CBC
HCT: 27.6 — ABNORMAL LOW
HCT: 31.5 — ABNORMAL LOW
HCT: 31.6 — ABNORMAL LOW
HCT: 32 — ABNORMAL LOW
HCT: 32.5 — ABNORMAL LOW
HCT: 40.9
HCT: 45
Hemoglobin: 10.8 — ABNORMAL LOW
Hemoglobin: 10.9 — ABNORMAL LOW
Hemoglobin: 10.9 — ABNORMAL LOW
Hemoglobin: 14.5
Hemoglobin: 9.4 — ABNORMAL LOW
MCHC: 33.9
MCHC: 34
MCHC: 34.1
MCHC: 34.2
MCHC: 34.2
MCHC: 34.2
MCHC: 34.6
MCHC: 34.7
MCHC: 34.7
MCV: 87.2
MCV: 87.3
MCV: 87.3
MCV: 87.7
MCV: 87.8
MCV: 87.9
MCV: 88.1
MCV: 88.3
MCV: 88.9
Platelets: 133 — ABNORMAL LOW
Platelets: 149 — ABNORMAL LOW
Platelets: 163
Platelets: 177
Platelets: 196
Platelets: 204
Platelets: 206
Platelets: 226
Platelets: 237
Platelets: 373
RBC: 3.16 — ABNORMAL LOW
RBC: 3.56 — ABNORMAL LOW
RBC: 3.61 — ABNORMAL LOW
RBC: 3.64 — ABNORMAL LOW
RBC: 3.7 — ABNORMAL LOW
RBC: 4.61
RDW: 13.1
RDW: 13.3
RDW: 13.4
RDW: 13.4
RDW: 13.4
RDW: 13.4
RDW: 13.4
RDW: 13.7
RDW: 13.7
WBC: 10.3
WBC: 10.4
WBC: 11.4 — ABNORMAL HIGH
WBC: 12 — ABNORMAL HIGH
WBC: 12.6 — ABNORMAL HIGH
WBC: 13.5 — ABNORMAL HIGH
WBC: 14.4 — ABNORMAL HIGH

## 2011-07-22 LAB — TYPE AND SCREEN: ABO/RH(D): B POS

## 2011-07-22 LAB — POCT I-STAT 4, (NA,K, GLUC, HGB,HCT)
Glucose, Bld: 108 — ABNORMAL HIGH
Glucose, Bld: 114 — ABNORMAL HIGH
Glucose, Bld: 116 — ABNORMAL HIGH
Glucose, Bld: 116 — ABNORMAL HIGH
Glucose, Bld: 90
Glucose, Bld: 97
HCT: 22 — ABNORMAL LOW
HCT: 24 — ABNORMAL LOW
HCT: 26 — ABNORMAL LOW
HCT: 30 — ABNORMAL LOW
HCT: 32 — ABNORMAL LOW
Hemoglobin: 10.9 — ABNORMAL LOW
Hemoglobin: 7.8 — CL
Hemoglobin: 8.8 — ABNORMAL LOW
Hemoglobin: 8.8 — ABNORMAL LOW
Hemoglobin: 8.8 — ABNORMAL LOW
Operator id: 3291
Operator id: 3291
Operator id: 3291
Potassium: 4.1
Potassium: 4.1
Potassium: 4.6
Potassium: 4.8
Potassium: 5.3 — ABNORMAL HIGH
Sodium: 136
Sodium: 138

## 2011-07-22 LAB — BLOOD GAS, ARTERIAL
Acid-Base Excess: 7.7 — ABNORMAL HIGH
Acid-Base Excess: 8.3 — ABNORMAL HIGH
Bicarbonate: 29.7 — ABNORMAL HIGH
Bicarbonate: 34.1 — ABNORMAL HIGH
Drawn by: 23588
Drawn by: 277341
FIO2: 0.21
O2 Content: 2
O2 Content: 3
O2 Saturation: 95.6
O2 Saturation: 98.8
PEEP: 15
Patient temperature: 98.6
TCO2: 36.4
pCO2 arterial: 50.4 — ABNORMAL HIGH
pCO2 arterial: 68.6
pH, Arterial: 7.32 — ABNORMAL LOW
pH, Arterial: 7.377
pH, Arterial: 7.422
pO2, Arterial: 58.9 — ABNORMAL LOW
pO2, Arterial: 80.6
pO2, Arterial: 87.8

## 2011-07-22 LAB — POCT I-STAT 3, VENOUS BLOOD GAS (G3P V)
Bicarbonate: 26 — ABNORMAL HIGH
O2 Saturation: 81
TCO2: 27
pCO2, Ven: 42 — ABNORMAL LOW
pH, Ven: 7.401 — ABNORMAL HIGH
pO2, Ven: 46 — ABNORMAL HIGH

## 2011-07-22 LAB — I-STAT 8, (EC8 V) (CONVERTED LAB)
Bicarbonate: 32 — ABNORMAL HIGH
Glucose, Bld: 100 — ABNORMAL HIGH
TCO2: 34
pH, Ven: 7.334 — ABNORMAL HIGH

## 2011-07-22 LAB — POCT I-STAT 3, ART BLOOD GAS (G3+)
Acid-base deficit: 4 — ABNORMAL HIGH
Bicarbonate: 25.7 — ABNORMAL HIGH
O2 Saturation: 96
O2 Saturation: 99
O2 Saturation: 99
Operator id: 183261
Operator id: 279121
Operator id: 3291
Operator id: 3291
Patient temperature: 100.3
Patient temperature: 36.6
Patient temperature: 98.7
TCO2: 22
TCO2: 37
pCO2 arterial: 36.6
pCO2 arterial: 39.5
pCO2 arterial: 41
pCO2 arterial: 60.2
pH, Arterial: 7.203 — ABNORMAL LOW
pH, Arterial: 7.378
pH, Arterial: 7.388
pH, Arterial: 7.405
pO2, Arterial: 216 — ABNORMAL HIGH
pO2, Arterial: 225 — ABNORMAL HIGH
pO2, Arterial: 48 — ABNORMAL LOW

## 2011-07-22 LAB — COMPREHENSIVE METABOLIC PANEL
ALT: 18
BUN: 21
Calcium: 9.7
Glucose, Bld: 107 — ABNORMAL HIGH
Sodium: 142
Total Protein: 7.2

## 2011-07-22 LAB — CK TOTAL AND CKMB (NOT AT ARMC)
CK, MB: 2.5
CK, MB: 3.5
Relative Index: INVALID
Total CK: 143

## 2011-07-22 LAB — HEMOGLOBIN AND HEMATOCRIT, BLOOD
HCT: 27.2 — ABNORMAL LOW
Hemoglobin: 9.3 — ABNORMAL LOW

## 2011-07-22 LAB — POCT I-STAT CREATININE
Creatinine, Ser: 1.2
Operator id: 189501
Operator id: 189501

## 2011-07-22 LAB — HEPARIN LEVEL (UNFRACTIONATED)
Heparin Unfractionated: 0.18 — ABNORMAL LOW
Heparin Unfractionated: 0.28 — ABNORMAL LOW
Heparin Unfractionated: 0.29 — ABNORMAL LOW
Heparin Unfractionated: 0.29 — ABNORMAL LOW

## 2011-07-22 LAB — DIFFERENTIAL
Basophils Relative: 0
Monocytes Relative: 7
Neutro Abs: 8.2 — ABNORMAL HIGH
Neutrophils Relative %: 72

## 2011-07-22 LAB — CARDIAC PANEL(CRET KIN+CKTOT+MB+TROPI)
CK, MB: 1.4
CK, MB: 2
CK, MB: 2.4
Total CK: 100
Total CK: 191

## 2011-07-22 LAB — URINALYSIS, ROUTINE W REFLEX MICROSCOPIC
Ketones, ur: NEGATIVE
Leukocytes, UA: NEGATIVE
Nitrite: NEGATIVE
Protein, ur: NEGATIVE
Urobilinogen, UA: 1

## 2011-07-22 LAB — B-NATRIURETIC PEPTIDE (CONVERTED LAB)
Pro B Natriuretic peptide (BNP): 107 — ABNORMAL HIGH
Pro B Natriuretic peptide (BNP): 195 — ABNORMAL HIGH

## 2011-07-22 LAB — APTT: aPTT: 26

## 2011-07-22 LAB — MAGNESIUM
Magnesium: 2.3
Magnesium: 2.3
Magnesium: 2.5

## 2011-07-22 LAB — PROTIME-INR: INR: 1

## 2011-07-22 LAB — TSH: TSH: 1.684

## 2011-07-22 LAB — PLATELET COUNT: Platelets: 156

## 2011-09-08 ENCOUNTER — Other Ambulatory Visit: Payer: Self-pay | Admitting: Cardiovascular Disease

## 2011-09-09 ENCOUNTER — Telehealth: Payer: Self-pay

## 2011-09-09 MED ORDER — METOPROLOL TARTRATE 50 MG PO TABS: 100.0000 mg | ORAL_TABLET | Freq: Two times a day (BID) | ORAL | Status: DC

## 2011-09-09 NOTE — Telephone Encounter (Signed)
Refill sent for metoprolol for [facility] in [place].

## 2011-09-23 ENCOUNTER — Other Ambulatory Visit: Payer: Self-pay | Admitting: Internal Medicine

## 2011-09-23 ENCOUNTER — Ambulatory Visit (INDEPENDENT_AMBULATORY_CARE_PROVIDER_SITE_OTHER): Payer: Medicare Other | Admitting: *Deleted

## 2011-09-23 ENCOUNTER — Encounter: Payer: Self-pay | Admitting: Internal Medicine

## 2011-09-23 DIAGNOSIS — I495 Sick sinus syndrome: Secondary | ICD-10-CM

## 2011-09-30 LAB — REMOTE PACEMAKER DEVICE
AL THRESHOLD: 0.75 V
BAMS-0001: 160 {beats}/min
BATTERY VOLTAGE: 2.77 V
RV LEAD AMPLITUDE: 22.4 mv
RV LEAD IMPEDENCE PM: 635 Ohm
VENTRICULAR PACING PM: 7

## 2011-10-01 NOTE — Progress Notes (Signed)
PPM remote 

## 2011-10-05 DIAGNOSIS — K922 Gastrointestinal hemorrhage, unspecified: Secondary | ICD-10-CM

## 2011-10-05 HISTORY — PX: OTHER SURGICAL HISTORY: SHX169

## 2011-10-05 HISTORY — DX: Gastrointestinal hemorrhage, unspecified: K92.2

## 2011-10-09 IMAGING — CR DG CHEST 2V
1 series · 3 of 3 positions shown · non-contrast
Comparison: none

REASON FOR EXAM: shortness of breath edema  fax [phone]  [facility]
[place] [facility]
COMMENTS:

[Series 1: view not recorded · 0.17mm/px · 3 of 3 slices shown]
[im 1/3]
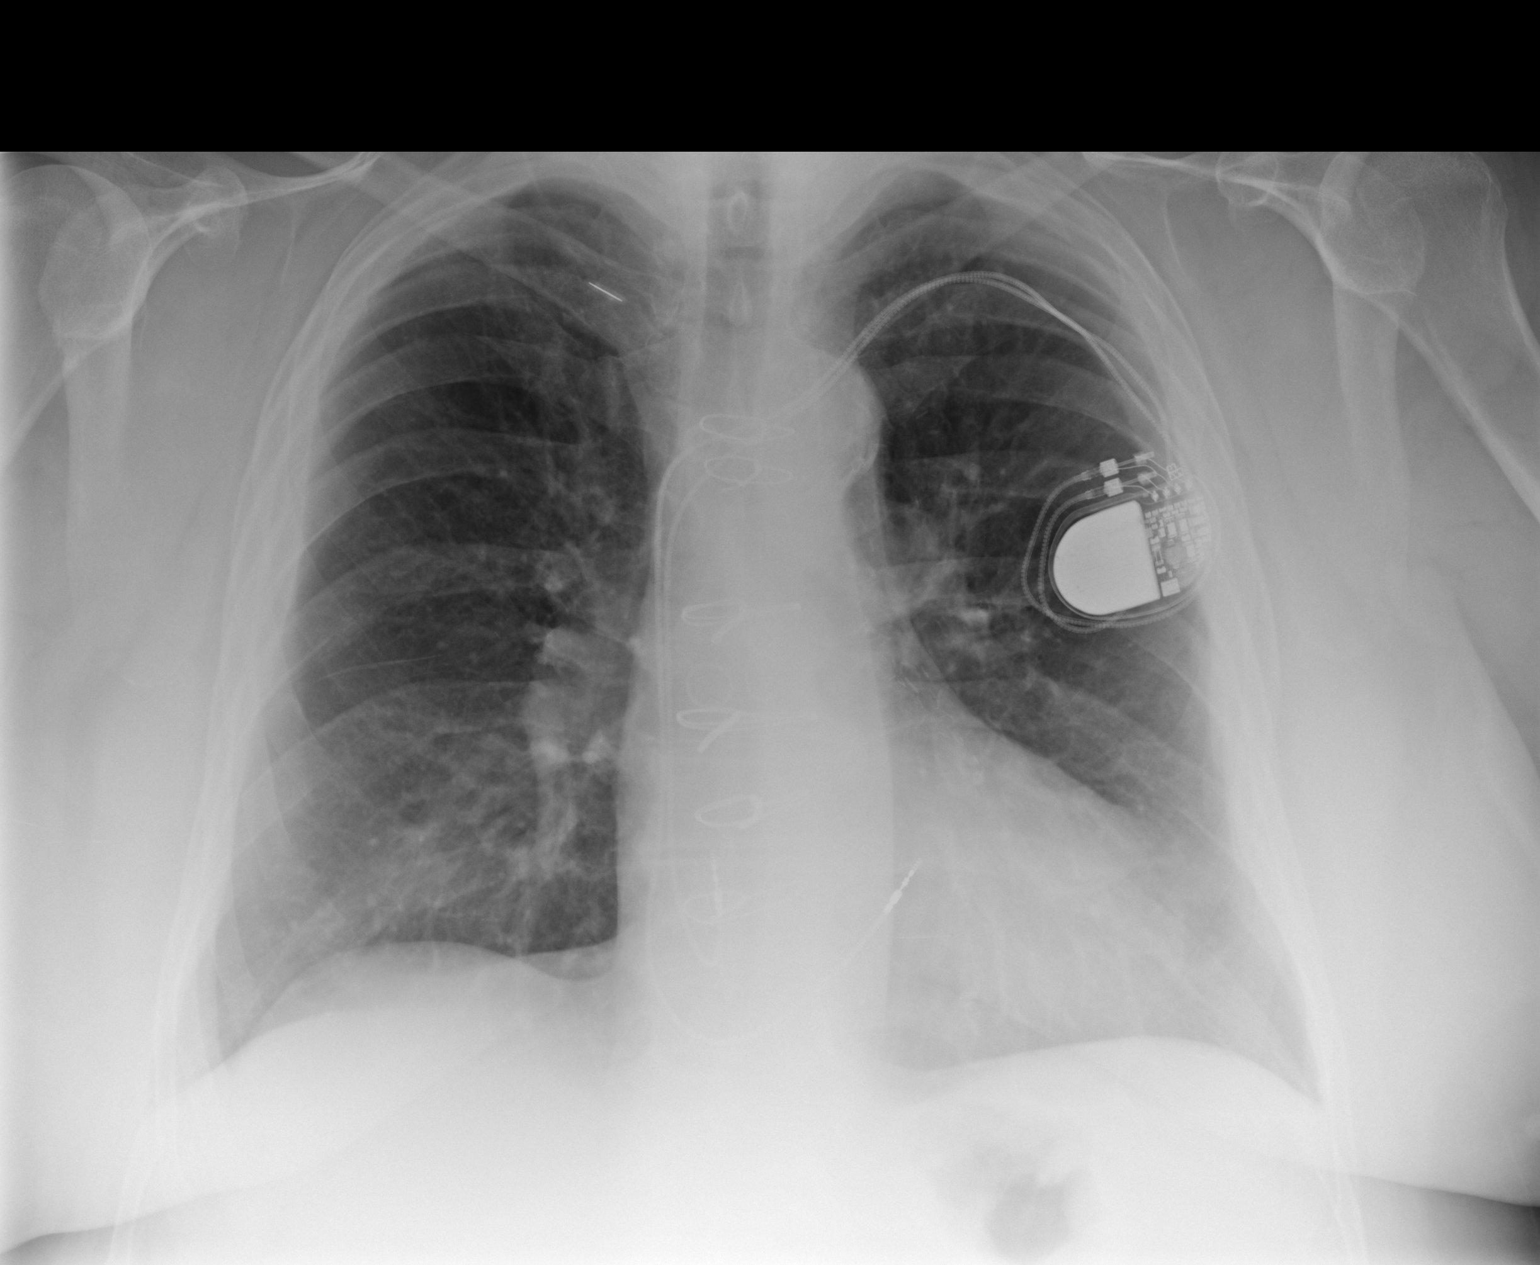
[im 2/3]
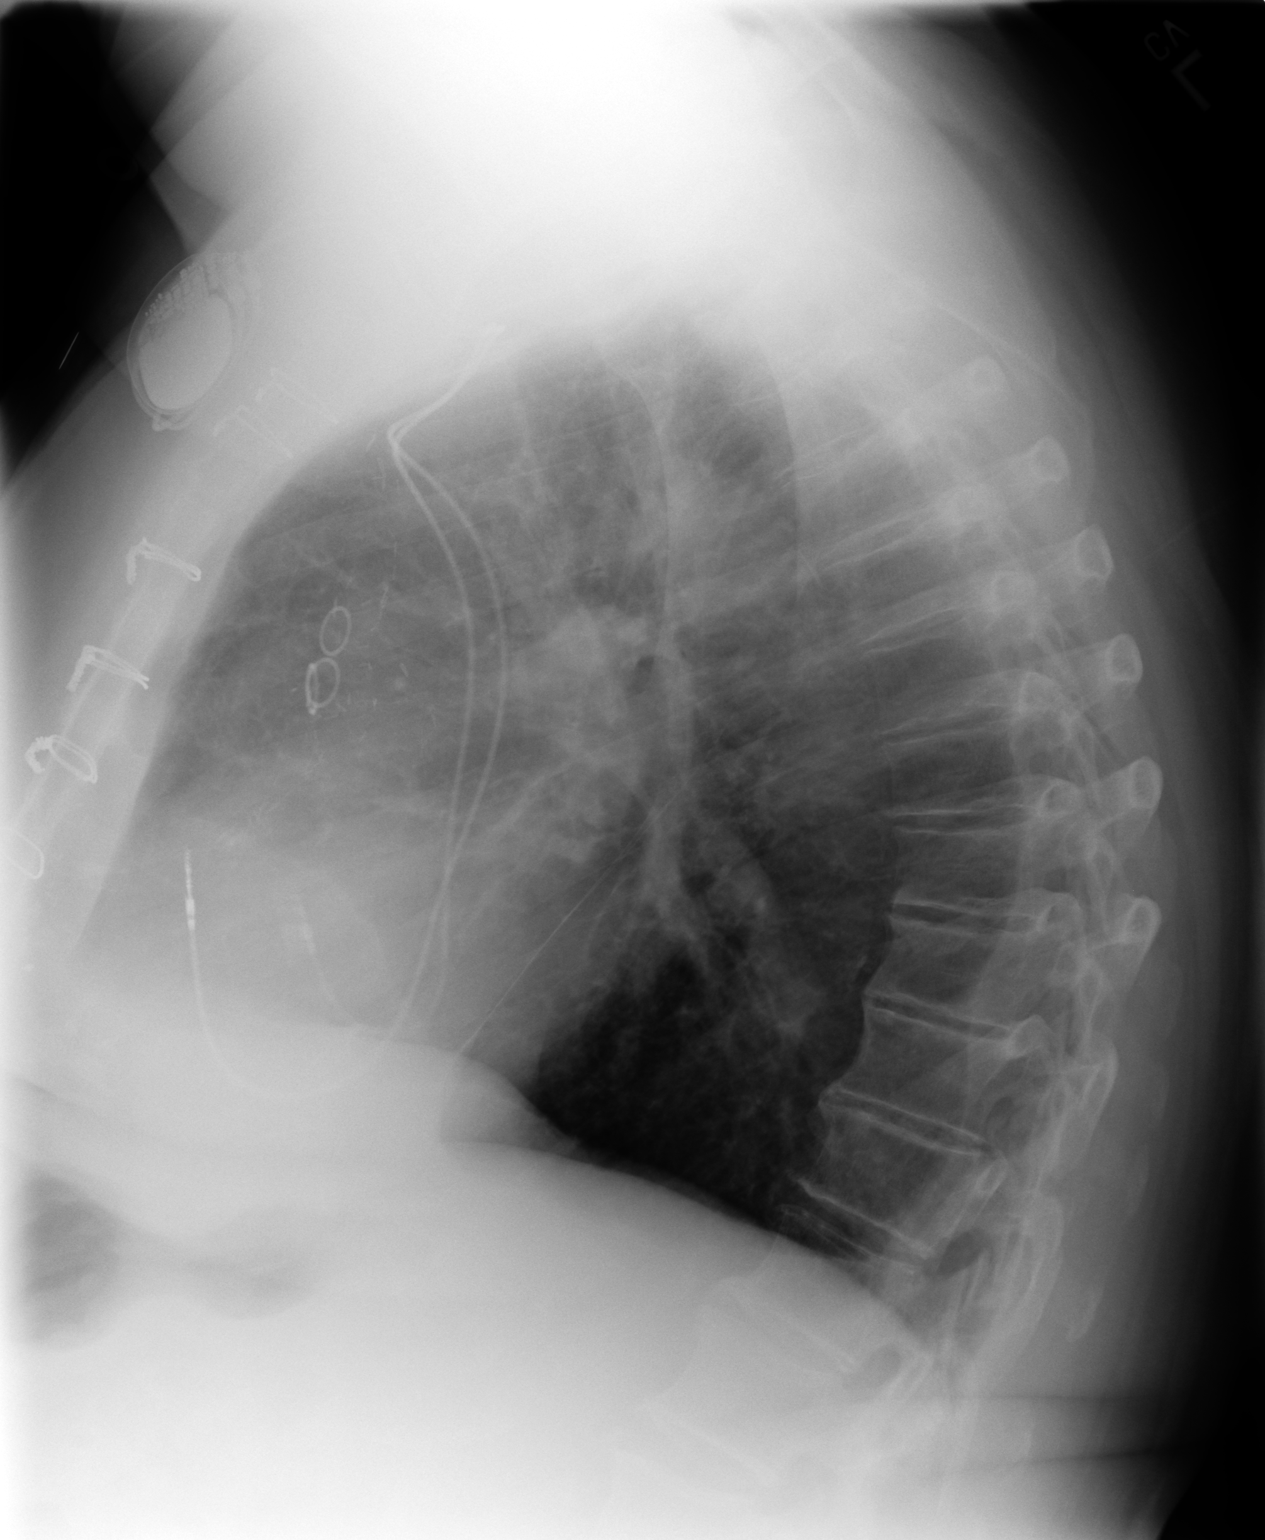
[im 3/3]
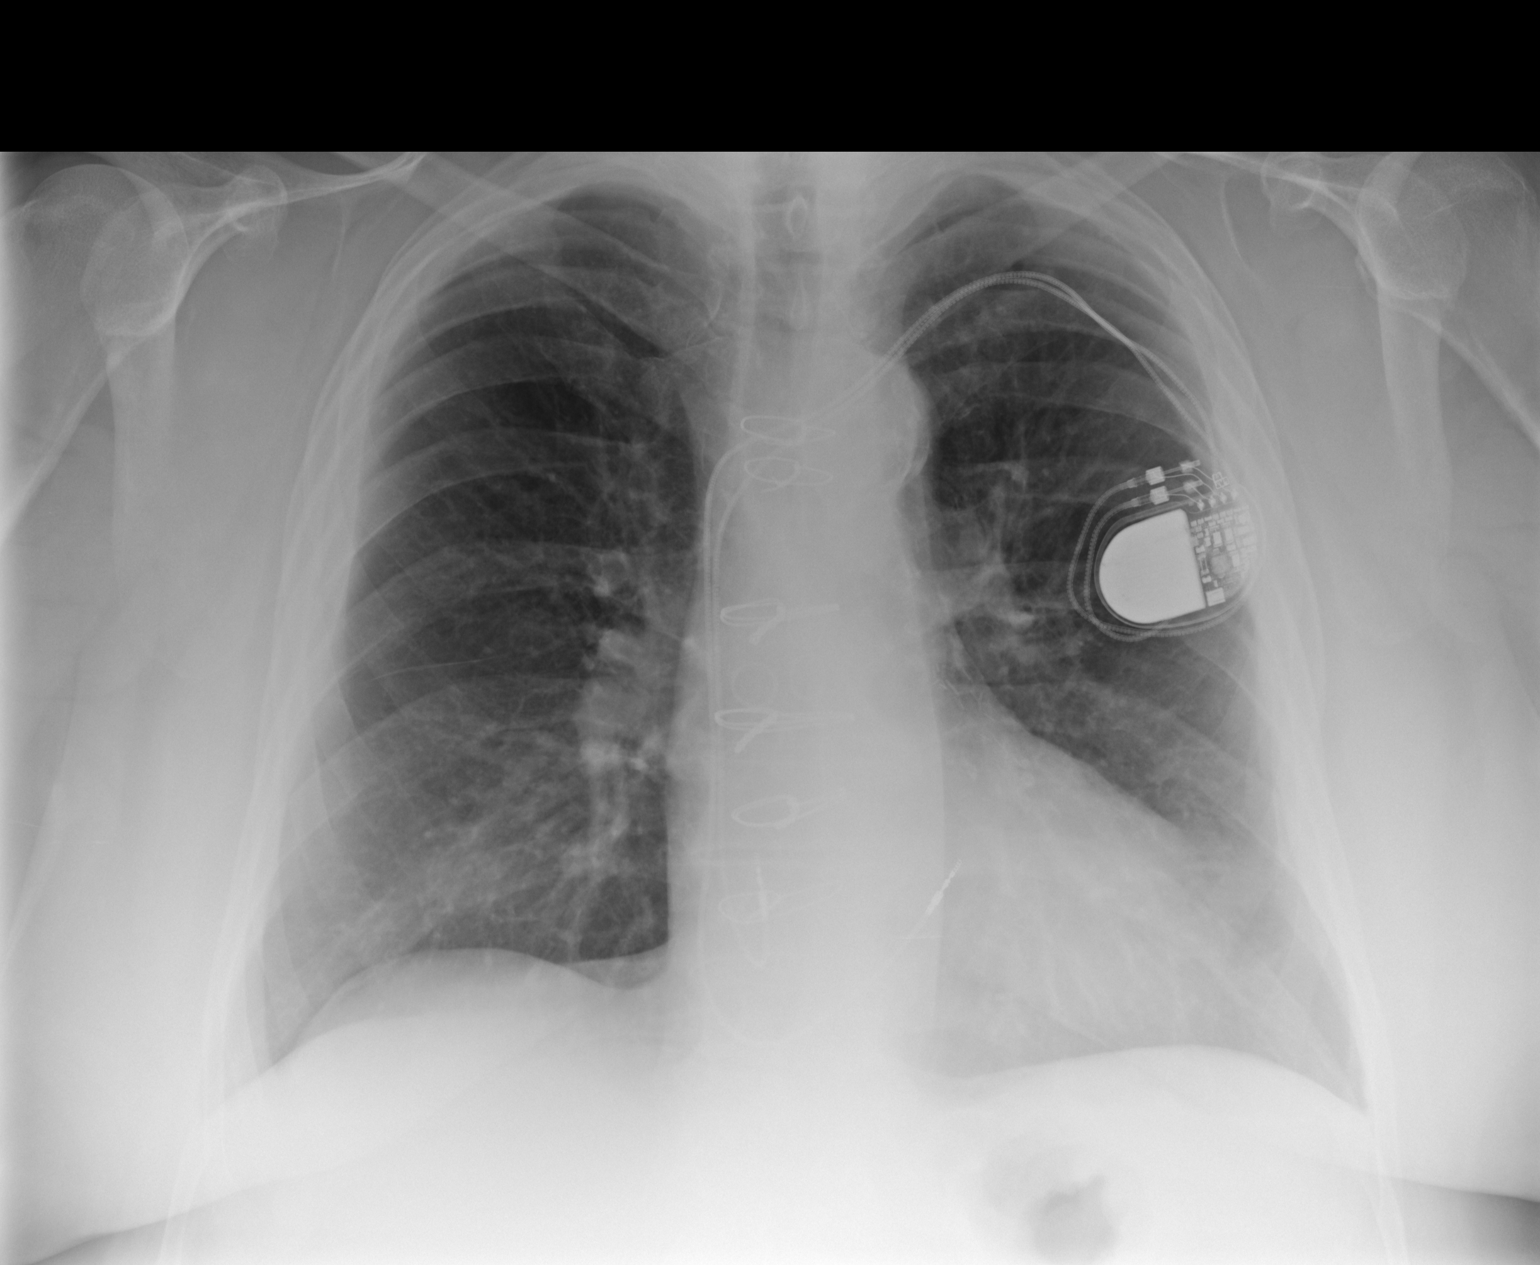

[3 of 3 positions shown; findings below may reference images not displayed]

PROCEDURE:     MDR - MDR CHEST PA(OR AP) AND LATERAL  - September 19, 2009 [DATE]

RESULT:     Comparison is made to the prior exam of 11/28/2008. The lung
fields are clear. The heart, mediastinal and osseous structures show no
significant abnormalities. Heart size is within normal limits. Cardiac
pacemaker is present. Postoperative changes of prior CABG are noted. No
acute bony abnormalities are seen.
IMPRESSION: 1. No acute changes are identified.

## 2011-10-13 IMAGING — CR DG CHEST 1V PORT
1 series · 1 of 1 positions shown · non-contrast
Comparison: none

REASON FOR EXAM: tachypnea - shortness of breath - GI bleed
COMMENTS:

[view not recorded]
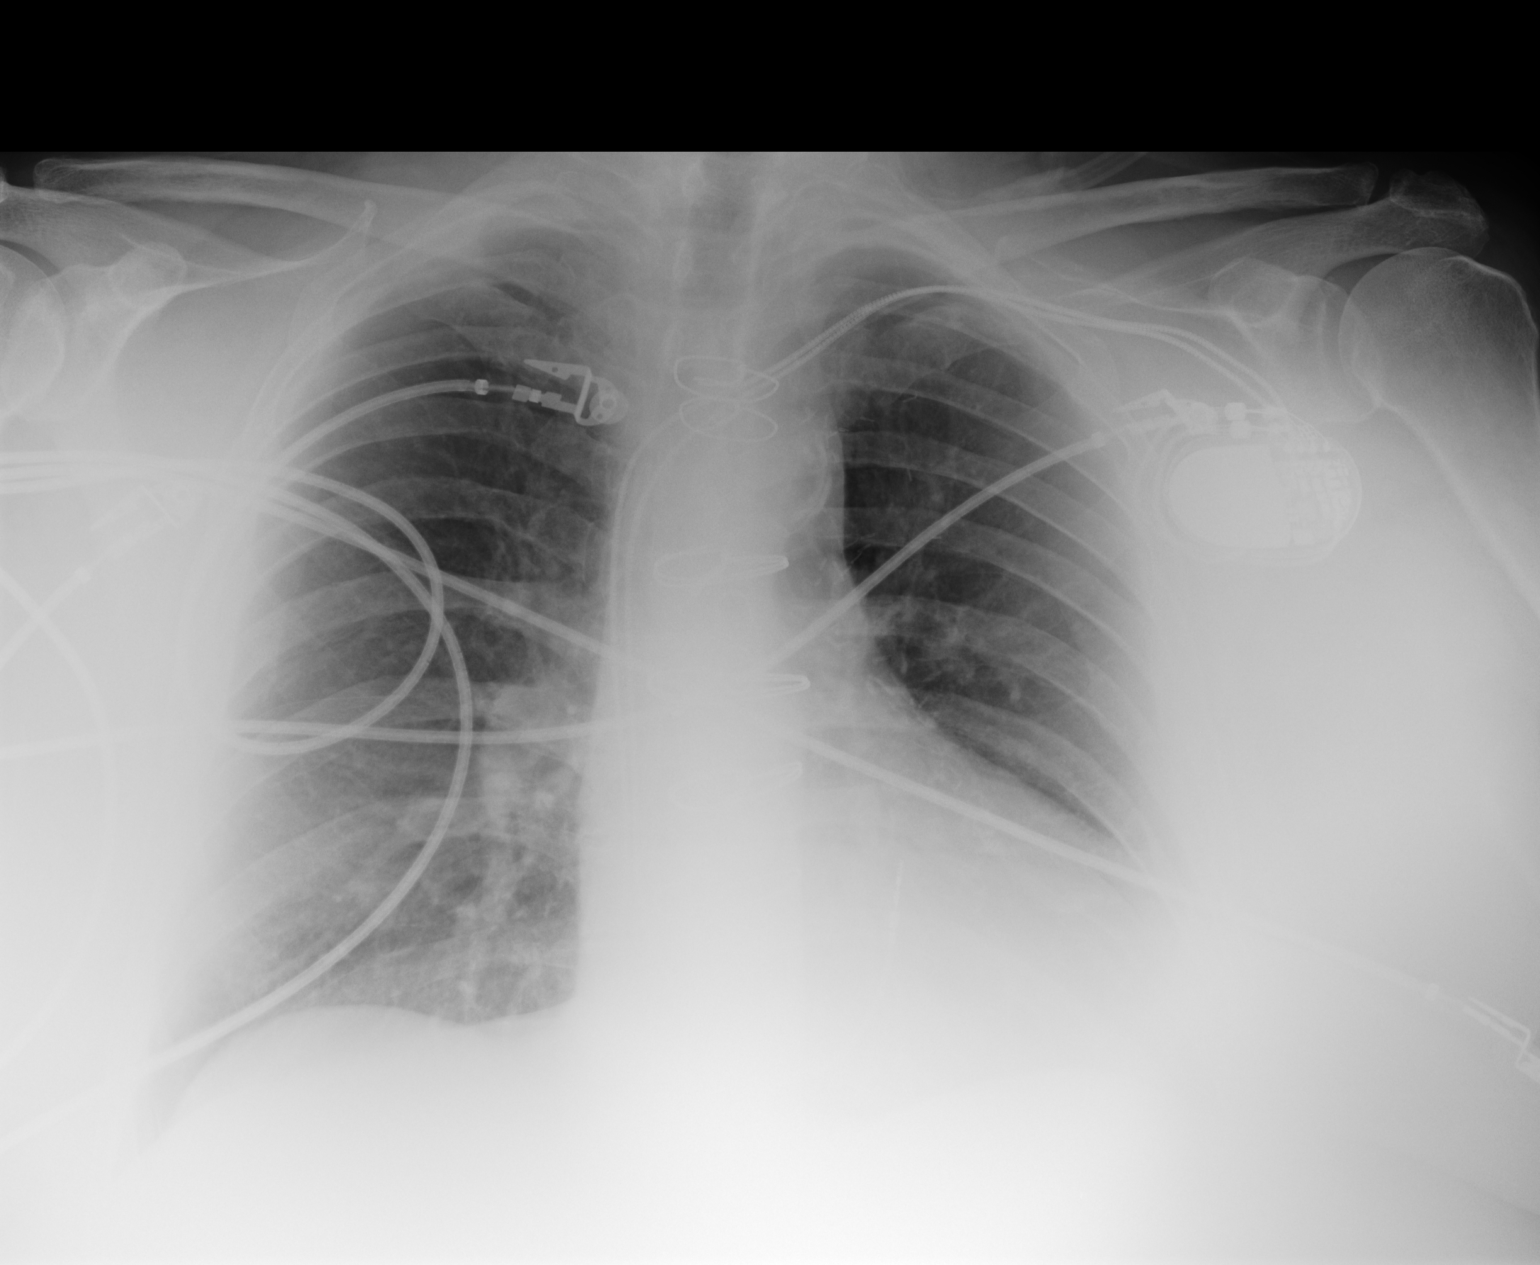

[1 of 1 positions shown; findings below may reference images not displayed]

PROCEDURE:     DXR - DXR PORTABLE CHEST SINGLE VIEW  - September 23, 2009 [DATE]

RESULT:     Comparison is made to the prior exam of 09/19/2009. The lung
fields are clear. No pneumonia, pneumothorax or pleural effusion is seen.
Heart size is normal. Monitoring electrodes are present. A cardiac pacemaker
is noted.
IMPRESSION: No acute changes are identified.

## 2011-10-20 ENCOUNTER — Encounter: Payer: Self-pay | Admitting: *Deleted

## 2011-11-03 ENCOUNTER — Telehealth: Payer: Self-pay

## 2011-11-03 MED ORDER — AMLODIPINE BESYLATE 10 MG PO TABS: 10.0000 mg | ORAL_TABLET | Freq: Every day | ORAL | Status: DC

## 2011-11-03 NOTE — Telephone Encounter (Signed)
Refill sent for amlodipine.  

## 2011-11-11 ENCOUNTER — Other Ambulatory Visit: Payer: Self-pay | Admitting: Cardiovascular Disease

## 2011-11-11 MED ORDER — AMLODIPINE BESYLATE 10 MG PO TABS: 10.0000 mg | ORAL_TABLET | Freq: Every day | ORAL | Status: DC

## 2011-11-11 NOTE — Telephone Encounter (Signed)
Refill amlodipine sent in today. Danielle Rankin

## 2011-11-15 ENCOUNTER — Ambulatory Visit: Payer: Medicare Other | Admitting: Cardiovascular Disease

## 2011-11-19 ENCOUNTER — Ambulatory Visit: Payer: Medicare Other | Admitting: Cardiovascular Disease

## 2011-12-09 ENCOUNTER — Ambulatory Visit (INDEPENDENT_AMBULATORY_CARE_PROVIDER_SITE_OTHER): Payer: Medicare Other | Admitting: Cardiovascular Disease

## 2011-12-09 ENCOUNTER — Encounter: Payer: Self-pay | Admitting: Cardiovascular Disease

## 2011-12-09 ENCOUNTER — Other Ambulatory Visit: Payer: Self-pay | Admitting: *Deleted

## 2011-12-09 VITALS — BP 148/84 | HR 66 | Ht 68.0 in | Wt 292.0 lb

## 2011-12-09 DIAGNOSIS — J449 Chronic obstructive pulmonary disease, unspecified: Secondary | ICD-10-CM

## 2011-12-09 DIAGNOSIS — R0989 Other specified symptoms and signs involving the circulatory and respiratory systems: Secondary | ICD-10-CM

## 2011-12-09 DIAGNOSIS — E785 Hyperlipidemia, unspecified: Secondary | ICD-10-CM

## 2011-12-09 DIAGNOSIS — R609 Edema, unspecified: Secondary | ICD-10-CM

## 2011-12-09 DIAGNOSIS — I2510 Atherosclerotic heart disease of native coronary artery without angina pectoris: Secondary | ICD-10-CM

## 2011-12-09 DIAGNOSIS — I472 Ventricular tachycardia: Secondary | ICD-10-CM

## 2011-12-09 MED ORDER — FUROSEMIDE 40 MG PO TABS: 40.0000 mg | ORAL_TABLET | Freq: Two times a day (BID) | ORAL | Status: DC

## 2011-12-09 MED ORDER — ISOSORBIDE MONONITRATE ER 30 MG PO TB24: 30.0000 mg | ORAL_TABLET | Freq: Every day | ORAL | Status: DC

## 2011-12-09 NOTE — Assessment & Plan Note (Signed)
Currently with no symptoms of angina. No further workup at this time. Continue current medication regimen. 

## 2011-12-09 NOTE — Progress Notes (Signed)
Patient ID: Scott Krause, male    DOB: 1941-05-14, 71 y.o.   MRN: [id number]  HPI Comments: 71 year old gentleman with severe obesity, coronary artery disease, bypass surgery with severe native three-vessel disease, obstructive sleep apnea, left catheterization Feb 2010 showing patent grafts, with pacemaker for tachybrady syndrome, admitted to the hospital for nonsustained VT and frequent PVCs, weakness and dizziness that occurred while playing golf,  who presents for routine follow up.  Previous history of renal insufficiency with aggressive diuresis.  He continues to have problems with his weight. He reports that family members have been giving him a hard time about his inability to control weight. Over the past week or so, he has restarted a low carbohydrate diet. He has also started using his bike and doing more exercise. He does have shortness of breath at baseline. No significant chest pain. He was unable to tolerate higher doses of metoprolol and is currently taking 50 mg twice a day. He denies any episodes of dizziness.   Blood pressure at home has been well controlled in the 120-130 range. Hemoglobin A1c is 6, total cholesterol 154. COPD is stable, managed on inhalers. He is getting over the flu  EKG shows Sinus rhythm with rate 69 beats per minute, right bundle branch block  Recent cholesterol is 135, LDL 72, HDL 29, creatinine 1.13  Outpatient Encounter Prescriptions as of 12/09/2011  Medication Sig Dispense Refill  . allopurinol (ZYLOPRIM) 300 MG tablet Take 1 tablet by mouth daily.      Marland Kitchen amLODipine (NORVASC) 10 MG tablet Take 1 tablet (10 mg total) by mouth daily.  30 tablet  6  . aspirin 81 MG tablet Take 81 mg by mouth daily.        Marland Kitchen atorvastatin (LIPITOR) 80 MG tablet Take 40 mg by mouth daily.        Marland Kitchen COLCRYS 0.6 MG tablet Take 1 tablet by mouth daily.      . Fluticasone-Salmeterol (ADVAIR) 250-50 MCG/DOSE AEPB Inhale 1 puff into the lungs 2 (two) times daily.  60 each  6   . furosemide (LASIX) 40 MG tablet Take 1 tablet (40 mg total) by mouth 2 (two) times daily.  60 tablet  6  . metoprolol (LOPRESSOR) 50 MG tablet Take 2 tablets (100 mg total) by mouth 2 (two) times daily.  120 tablet  6  . mometasone-formoterol (DULERA) 100-5 MCG/ACT AERO Inhale 2 puffs into the lungs 2 (two) times daily as needed.        . Multiple Vitamin (MULTI-VITAMIN PO) Take by mouth daily.        . Omega-3 Fatty Acids (FISH OIL) 1000 MG CAPS Take by mouth. Take two tablet by mouth twice a day       . omeprazole (PRILOSEC) 20 MG capsule Take 20 mg by mouth 2 (two) times daily.        . ramipril (ALTACE) 10 MG capsule Take 1 capsule (10 mg total) by mouth 2 (two) times daily.  60 capsule  6  . tiotropium (SPIRIVA) 18 MCG inhalation capsule Place 1 capsule (18 mcg total) into inhaler and inhale daily.  30 capsule  11    Review of Systems  HENT: Negative.   Eyes: Negative.   Respiratory: Positive for shortness of breath.   Cardiovascular: Negative.   Gastrointestinal: Negative.   Musculoskeletal: Negative.   Skin: Negative.   Hematological: Negative.   Psychiatric/Behavioral: Negative.   All other systems reviewed and are negative.    BP  148/84  Pulse 66  Ht 5\' 8"  (1.727 m)  Wt 292 lb (132.45 kg)  BMI 44.40 kg/m2  Physical Exam  Nursing note and vitals reviewed. Constitutional: He is oriented to person, place, and time. He appears well-developed and well-nourished.       Morbidly obese  HENT:  Head: Normocephalic.  Nose: Nose normal.  Mouth/Throat: Oropharynx is clear and moist.  Eyes: Conjunctivae are normal. Pupils are equal, round, and reactive to light.  Neck: Normal range of motion. Neck supple. No JVD present.  Cardiovascular: Normal rate, regular rhythm, S1 normal, S2 normal, normal heart sounds and intact distal pulses.  Exam reveals no gallop and no friction rub.   No murmur heard.      1+ pitting edema to the mid shins bilateral  Pulmonary/Chest: Effort  normal. No respiratory distress. He has no wheezes. He has no rales. He exhibits no tenderness.       Decreased breath sounds throughout.  Abdominal: Soft. Bowel sounds are normal. He exhibits no distension. There is no tenderness.  Musculoskeletal: Normal range of motion. He exhibits edema. He exhibits no tenderness.  Lymphadenopathy:    He has no cervical adenopathy.  Neurological: He is alert and oriented to person, place, and time. Coordination normal.  Skin: Skin is warm and dry. No rash noted. No erythema.  Psychiatric: He has a normal mood and affect. His behavior is normal. Judgment and thought content normal.           Assessment and Plan

## 2011-12-09 NOTE — Assessment & Plan Note (Signed)
Shortness of breath likely secondary to his weight and deconditioning. No further testing has been ordered. We have suggested he continue on his diuretic regimen.

## 2011-12-09 NOTE — Patient Instructions (Signed)
Cut the amlodipine in 1/2 to improve the leg swelling Monitor your blood pressure If your blood pressure rises, start isosorbide one a day  Please call us if you have new issues that need to be addressed before your next appt.  Your physician wants you to follow-up in: 6 months.  You will receive a reminder letter in the mail two months in advance. If you don't receive a letter, please call our office to schedule the follow-up appointment.

## 2011-12-09 NOTE — Assessment & Plan Note (Signed)
We have encouraged continued exercise, careful diet management in an effort to lose weight. 

## 2011-12-09 NOTE — Assessment & Plan Note (Signed)
Stable on his current inhalers. Followed by Dr. Meredeth Ide

## 2011-12-09 NOTE — Assessment & Plan Note (Signed)
Lower extremity edema is likely secondary to venous insufficiency, exacerbated by his obesity, possibly even exacerbation by amlodipine. we have suggested he cut his amlodipine in half to see if this helps his edema. For blood pressure, he could take isosorbide mononitrate 30 mg daily.

## 2011-12-09 NOTE — Assessment & Plan Note (Signed)
Cholesterol is at goal on the current lipid regimen. No changes to the medications were made.  

## 2011-12-13 ENCOUNTER — Telehealth: Payer: Self-pay

## 2011-12-13 MED ORDER — FUROSEMIDE 40 MG PO TABS: 40.0000 mg | ORAL_TABLET | Freq: Two times a day (BID) | ORAL | Status: DC

## 2011-12-13 NOTE — Telephone Encounter (Signed)
Refill sent for furosemide  

## 2011-12-26 ENCOUNTER — Other Ambulatory Visit: Payer: Self-pay | Admitting: Cardiovascular Disease

## 2011-12-30 ENCOUNTER — Ambulatory Visit (INDEPENDENT_AMBULATORY_CARE_PROVIDER_SITE_OTHER): Payer: Medicare Other | Admitting: *Deleted

## 2011-12-30 DIAGNOSIS — I495 Sick sinus syndrome: Secondary | ICD-10-CM

## 2011-12-30 DIAGNOSIS — I472 Ventricular tachycardia: Secondary | ICD-10-CM

## 2012-01-03 ENCOUNTER — Encounter: Payer: Self-pay | Admitting: Internal Medicine

## 2012-01-04 LAB — REMOTE PACEMAKER DEVICE
AL IMPEDENCE PM: 439 Ohm
ATRIAL PACING PM: 96
BATTERY VOLTAGE: 2.76 V
RV LEAD IMPEDENCE PM: 603 Ohm
VENTRICULAR PACING PM: 10

## 2012-01-05 NOTE — Progress Notes (Signed)
Remote pacer check  

## 2012-01-12 ENCOUNTER — Encounter: Payer: Self-pay | Admitting: *Deleted

## 2012-01-21 ENCOUNTER — Telehealth: Payer: Self-pay | Admitting: *Deleted

## 2012-01-21 ENCOUNTER — Telehealth: Payer: Self-pay | Admitting: Cardiovascular Disease

## 2012-01-21 NOTE — Telephone Encounter (Signed)
Dr. Kirke Corin reviewed. Pt is willing to wait until Dr. Mariah Milling is back in the office on the 29th. I will forward this to Dr. Mariah Milling. Mylo Red RN

## 2012-01-21 NOTE — Telephone Encounter (Signed)
Pt came into the office to get Dr Mariah Milling to write a letter for clearance so that pt can have a hip replacement. Please send letter of clearance to Dr Rosita Kea ofc phone # [phone] Fax # [phone]

## 2012-01-21 NOTE — Telephone Encounter (Signed)
LMTCB--nt 

## 2012-01-21 NOTE — Telephone Encounter (Signed)
Fu call °Pt returning your call  °

## 2012-01-21 NOTE — Telephone Encounter (Signed)
Pt is having a lot of left hip pain.  He thinks his surgeon may be giving him spinal anesthesia like he did with his last hip surgery. Pt is aware Dr. Mariah Milling is out of town until the 29th and I have forwarded this to Dr. Kirke Corin for review. Mylo Red RN

## 2012-01-21 NOTE — Telephone Encounter (Signed)
Follow-up:    Patient returned your call.  Please call back. 

## 2012-01-21 NOTE — Telephone Encounter (Signed)
Pt came into office needing a surgical clearance for hip replacement with Dr Rosita Kea. Surgery not set up yet waiting on clearance. Please send letter to Dr Rosita Kea fax [phone] phone [phone]

## 2012-01-27 ENCOUNTER — Encounter: Payer: Self-pay | Admitting: *Deleted

## 2012-01-27 NOTE — Telephone Encounter (Signed)
He would be acceptable risk for surgery. We can send a note to the surgeon, Dr. Rosita Kea. thx.

## 2012-01-27 NOTE — Telephone Encounter (Signed)
Pt aware clearance letter faxed today for pt hip surgery. Mylo Red RN

## 2012-02-24 ENCOUNTER — Other Ambulatory Visit: Payer: Self-pay | Admitting: Emergency Medicine

## 2012-04-13 ENCOUNTER — Other Ambulatory Visit: Payer: Self-pay | Admitting: *Deleted

## 2012-04-13 MED ORDER — METOPROLOL TARTRATE 50 MG PO TABS: 100.0000 mg | ORAL_TABLET | Freq: Two times a day (BID) | ORAL | Status: DC

## 2012-04-13 NOTE — Telephone Encounter (Signed)
Refilled Metoprolol

## 2012-05-02 ENCOUNTER — Ambulatory Visit (INDEPENDENT_AMBULATORY_CARE_PROVIDER_SITE_OTHER): Payer: Medicare Other | Admitting: Internal Medicine

## 2012-05-02 ENCOUNTER — Encounter: Payer: Self-pay | Admitting: Internal Medicine

## 2012-05-02 VITALS — BP 132/62 | HR 82 | Ht 68.0 in | Wt 277.0 lb

## 2012-05-02 DIAGNOSIS — I2589 Other forms of chronic ischemic heart disease: Secondary | ICD-10-CM

## 2012-05-02 DIAGNOSIS — I472 Ventricular tachycardia: Secondary | ICD-10-CM

## 2012-05-02 DIAGNOSIS — Z950 Presence of cardiac pacemaker: Secondary | ICD-10-CM

## 2012-05-02 DIAGNOSIS — I5032 Chronic diastolic (congestive) heart failure: Secondary | ICD-10-CM

## 2012-05-02 LAB — PACEMAKER DEVICE OBSERVATION
AL THRESHOLD: 0.75 V
ATRIAL PACING PM: 93
BAMS-0001: 160 {beats}/min
RV LEAD IMPEDENCE PM: 597 Ohm
RV LEAD THRESHOLD: 0.875 V
VENTRICULAR PACING PM: 8

## 2012-05-02 NOTE — Progress Notes (Signed)
HPI Mr. Fruth returns today for followup. He is a pleasant 71 yo man with a h/o symptomatic bradycardia, s/p PPM, HTN, and dyslipidemia. In the interim, he has done well except for problems with peripheral edema. He denies dietary indiscretion. He thinks that his amlodipine has caused his edema. He has fairly severe HTN however. No syncope or near syncope. No chest pain. No Known Allergies   Current Outpatient Prescriptions  Medication Sig Dispense Refill  . allopurinol (ZYLOPRIM) 300 MG tablet Take 1 tablet by mouth daily.      Marland Kitchen amLODipine (NORVASC) 10 MG tablet Take 1 tablet (10 mg total) by mouth daily.  30 tablet  6  . aspirin 81 MG tablet Take 81 mg by mouth daily.        Marland Kitchen atorvastatin (LIPITOR) 80 MG tablet Take 40 mg by mouth daily.        Marland Kitchen COLCRYS 0.6 MG tablet Take 1 tablet by mouth daily as needed.       . furosemide (LASIX) 40 MG tablet Take 40 mg by mouth daily.      . isosorbide mononitrate (IMDUR) 30 MG 24 hr tablet Take 1 tablet (30 mg total) by mouth daily.  30 tablet  11  . metoprolol (LOPRESSOR) 50 MG tablet Take 2 tablets (100 mg total) by mouth 2 (two) times daily.  120 tablet  6  . Multiple Vitamin (MULTI-VITAMIN PO) Take by mouth daily.        . Omega-3 Fatty Acids (FISH OIL) 1000 MG CAPS Take by mouth. Take two tablet by mouth twice a day       . omeprazole (PRILOSEC) 20 MG capsule Take 20 mg by mouth 2 (two) times daily.        . ramipril (ALTACE) 10 MG capsule TAKE 1 CAPSULE (10 MG TOTAL) BY MOUTH 2 (TWO) TIMES DAILY.  60 capsule  6  . tiotropium (SPIRIVA) 18 MCG inhalation capsule Place 1 capsule (18 mcg total) into inhaler and inhale daily.  30 capsule  11  . DISCONTD: furosemide (LASIX) 40 MG tablet Take 1 tablet (40 mg total) by mouth 2 (two) times daily.  60 tablet  6     Past Medical History  Diagnosis Date  . Morbid obesity   . CAD (coronary artery disease)   . COPD (chronic obstructive pulmonary disease)   . Cardiomyopathy     Ischemic  . Pacemaker    . Hypertension   . Hyperlipidemia   . Obstructive sleep apnea   . Chronic diastolic heart failure     ROS:   All systems reviewed and negative except as noted in the HPI.   Past Surgical History  Procedure Date  . Coronary artery bypass graft   . Right hip replacement   . Nerve repair in left arm   . Cardiac pacemaker placement     PPM  Medtronic  . Eye surgery     left retina  . Left hip replacement 2013     Family History  Problem Relation Age of Onset  . Coronary artery disease Other   . Heart disease Neg Hx      History   Social History  . Marital Status: Widowed    Spouse Name: N/A    Number of Children: N/A  . Years of Education: N/A   Occupational History  . Not on file.   Social History Main Topics  . Smoking status: Former Smoker -- 2.0 packs/day for 20 years  Types: Cigarettes    Quit date: 11/06/1995  . Smokeless tobacco: Former Neurosurgeon   Comment: Quit 20 years ago  . Alcohol Use: Yes     ocassional  . Drug Use: No  . Sexually Active: Not on file   Other Topics Concern  . Not on file   Social History Narrative   Widower, has girlfriendLives with son     BP 132/62  Pulse 82  Ht 5\' 8"  (1.727 m)  Wt 277 lb (125.646 kg)  BMI 42.12 kg/m2  Physical Exam:  Well appearing 71 yo man, NAD HEENT: Unremarkable Neck:  No JVD, no thyromegally Lungs:  Clear with no wheezes, rales, or rhonchi HEART:  Regular rate rhythm, no murmurs, no rubs, no clicks Abd:  soft, positive bowel sounds, no organomegally, no rebound, no guarding Ext:  2 plus pulses, 1-2+ edema, no cyanosis, no clubbing Skin:  No rashes no nodules Neuro:  CN II through XII intact, motor grossly intact  DEVICE  Normal device(Medtronic PPM)  function.  See PaceArt for details.   Assess/Plan:

## 2012-05-02 NOTE — Assessment & Plan Note (Signed)
His heart failure symptoms are well controlled. I have asked that he reduce his sodium intake and he will continue his diuretic.

## 2012-05-02 NOTE — Assessment & Plan Note (Signed)
His device( medtronic ppm) is working normally. Will recheck in several months.

## 2012-05-02 NOTE — Patient Instructions (Addendum)
Your physician wants you to follow-up in: 1 year with Dr. Court Joy will receive a reminder letter in the mail two months in advance. If you don't receive a letter, please call our office to schedule the follow-up appointment.  Remote monitoring is used to monitor your Pacemaker of ICD from home. This monitoring reduces the number of office visits required to check your device to one time per year. It allows [place] to keep an eye on the functioning of your device to ensure it is working properly. You are scheduled for a device check from home on August 07, 2012.. You may send your transmission at any time that day. If you have a wireless device, the transmission will be sent automatically. After your physician reviews your transmission, you will receive a postcard with your next transmission date.

## 2012-05-10 ENCOUNTER — Other Ambulatory Visit: Payer: Self-pay | Admitting: *Deleted

## 2012-05-10 MED ORDER — METOPROLOL TARTRATE 50 MG PO TABS: 100.0000 mg | ORAL_TABLET | Freq: Two times a day (BID) | ORAL | Status: DC

## 2012-05-10 NOTE — Telephone Encounter (Signed)
Refilled Metoprolol

## 2012-05-22 ENCOUNTER — Other Ambulatory Visit: Payer: Self-pay | Admitting: *Deleted

## 2012-05-22 MED ORDER — TIOTROPIUM BROMIDE MONOHYDRATE 18 MCG IN CAPS: 18.0000 ug | ORAL_CAPSULE | Freq: Every day | RESPIRATORY_TRACT | Status: AC

## 2012-05-22 NOTE — Telephone Encounter (Signed)
-   Refilled Spiriva  

## 2012-05-25 ENCOUNTER — Other Ambulatory Visit: Payer: Self-pay | Admitting: Cardiovascular Disease

## 2012-05-25 NOTE — Telephone Encounter (Signed)
Refilled Atorvastatin.

## 2012-06-06 ENCOUNTER — Ambulatory Visit: Payer: Medicare Other | Admitting: Cardiovascular Disease

## 2012-06-30 ENCOUNTER — Telehealth: Payer: Self-pay | Admitting: Cardiovascular Disease

## 2012-06-30 NOTE — Telephone Encounter (Signed)
Please see below as to what I told pt Thanks!

## 2012-06-30 NOTE — Telephone Encounter (Signed)
Pt says she had hip replacement 4 weeks ago. He is having PT that comes out to his home on a regular basis.  He says PT has noticed worsening PVCs when they check pt's HR.  Pt has also noticed more frequent "skipped beats" associated with weakness. He denies CP or SOB.   He also mentions being under more stress than usual as well as drinking more caffeine than usual. I explained both of these could make palpitations worse. He then tells me he was started on a blood thinner post op and has finished these now. He is unsure of name of medication. He mentions black stools since 2 days ago. He did not call Dr. Arlana Pouch. I advised him to call their office ASAP to report black stools since taking blood thinner, that anemia could make palpitations and weakness worse.  He will do this now. He also confirms he is taking lasix QD without KCL replacement but is taking ramipril as well. I offered to bring him to our office today for labs, but he says he does not have a ride.  He will restrict caffeine this w/e, will call Dr. Maree Krabbe office now and will come to our office Monday 9/30 for potassium check/BMP and CBC (if Dr. Arlana Pouch does not address). Understanding verb.

## 2012-06-30 NOTE — Telephone Encounter (Signed)
Pt calling states that he is having increased PVC's with excertion.

## 2012-07-01 ENCOUNTER — Other Ambulatory Visit: Payer: Self-pay | Admitting: Emergency Medicine

## 2012-07-02 DIAGNOSIS — I499 Cardiac arrhythmia, unspecified: Secondary | ICD-10-CM

## 2012-07-02 DIAGNOSIS — R0602 Shortness of breath: Secondary | ICD-10-CM

## 2012-07-03 ENCOUNTER — Telehealth: Payer: Self-pay

## 2012-07-03 NOTE — Telephone Encounter (Signed)
Message copied by Marcelle Overlie on Mon Jul 03, 2012  8:33 AM ------      Message from: [facility], Larken Urias E      Created: Fri Jun 30, 2012  2:17 PM      Regarding: BMP       Call to assess pt's palpitations/PVCs

## 2012-07-03 NOTE — Telephone Encounter (Signed)
I called pt to check on his symptoms. He says he is currently in hosp at [facility]. He called EMS on Saturday 07/01/12 for weakness and blood in stool. He denies worsening palpitations and says Dr. Mariah Milling rounded on him this w/e. He will call us when he is d/c to let us know of any concerns.

## 2012-07-04 DIAGNOSIS — R0602 Shortness of breath: Secondary | ICD-10-CM

## 2012-07-04 NOTE — Telephone Encounter (Signed)
Seen in hospital  Started on amiodarone 200 mg bid May leave hospital today Needs follow up

## 2012-07-05 ENCOUNTER — Other Ambulatory Visit: Payer: Self-pay

## 2012-07-05 MED ORDER — AMLODIPINE BESYLATE 10 MG PO TABS: 10.0000 mg | ORAL_TABLET | Freq: Every day | ORAL | Status: DC

## 2012-07-05 NOTE — Telephone Encounter (Signed)
Refill sent for amlodipine 10 mg take one tablet daily.  

## 2012-07-05 NOTE — Telephone Encounter (Signed)
Has hosp f/u scheduled for 10/14

## 2012-07-06 ENCOUNTER — Telehealth: Payer: Self-pay

## 2012-07-06 ENCOUNTER — Encounter: Payer: Self-pay | Admitting: Cardiovascular Disease

## 2012-07-06 NOTE — Telephone Encounter (Signed)
TCM call: I called to assess pt's symptoms since d/c from hospital 10/1 He says he is feeling well Says PVCs have decreased and has not noticed anymore blood in stool He has started amiodarone as prescribed and tolerates this well He confirms appt with Dr. Mariah Milling 07/17/12 He also confirms he has all RX he needs and is compliant with meds

## 2012-07-06 NOTE — Telephone Encounter (Signed)
TCM call

## 2012-07-17 ENCOUNTER — Encounter: Payer: Self-pay | Admitting: Cardiovascular Disease

## 2012-07-17 ENCOUNTER — Ambulatory Visit (INDEPENDENT_AMBULATORY_CARE_PROVIDER_SITE_OTHER): Payer: Medicare Other | Admitting: Cardiovascular Disease

## 2012-07-17 VITALS — BP 124/60 | HR 82 | Ht 68.0 in | Wt 283.5 lb

## 2012-07-17 DIAGNOSIS — I10 Essential (primary) hypertension: Secondary | ICD-10-CM

## 2012-07-17 DIAGNOSIS — I2589 Other forms of chronic ischemic heart disease: Secondary | ICD-10-CM

## 2012-07-17 DIAGNOSIS — R609 Edema, unspecified: Secondary | ICD-10-CM

## 2012-07-17 DIAGNOSIS — I472 Ventricular tachycardia: Secondary | ICD-10-CM

## 2012-07-17 DIAGNOSIS — R0602 Shortness of breath: Secondary | ICD-10-CM

## 2012-07-17 DIAGNOSIS — I4891 Unspecified atrial fibrillation: Secondary | ICD-10-CM | POA: Insufficient documentation

## 2012-07-17 DIAGNOSIS — I5032 Chronic diastolic (congestive) heart failure: Secondary | ICD-10-CM

## 2012-07-17 DIAGNOSIS — I2510 Atherosclerotic heart disease of native coronary artery without angina pectoris: Secondary | ICD-10-CM

## 2012-07-17 NOTE — Assessment & Plan Note (Signed)
No significant edema in the right lower extremity, chronic mild lower extremity edema on the left. We have suggested he continue on Lasix daily with extra Lasix for any worsening shortness of breath or weight gain.

## 2012-07-17 NOTE — Assessment & Plan Note (Signed)
Edema of the left lower extremity concerning for venous insufficiency. This is the leg with left hip surgery x2.

## 2012-07-17 NOTE — Assessment & Plan Note (Signed)
No recent atrial fibrillation. He did have significant PVCs while in the hospital and was very symptomatic. We have suggested he decrease his amiodarone to 200 mg once a day. He is not a good candidate for anticoagulation if he does have atrial fibrillation as he has had GI bleed.

## 2012-07-17 NOTE — Patient Instructions (Addendum)
  Continue amiodarone one a day for atrial fib prevention and PVCs Hold the fish oil as this can thin your blood Take extra lasix as needed for shortness of breath  Restart aspirin every other day for a few weeks, then increase to everyday  Please call us if you have new issues that need to be addressed before your next appt.  Your physician wants you to follow-up in: 6 months.  You will receive a reminder letter in the mail two months in advance. If you don't receive a letter, please call our office to schedule the follow-up appointment.

## 2012-07-17 NOTE — Progress Notes (Signed)
Patient ID: Scott Krause, male    DOB: 12/05/1940, 71 y.o.   MRN: [id number]  HPI Comments: 71 year old gentleman with severe obesity, coronary artery disease, bypass surgery with severe native three-vessel disease, obstructive sleep apnea, left catheterization Feb 2010 showing patent grafts, with pacemaker for tachybrady syndrome, admitted to the hospital for nonsustained VT and frequent PVCs, weakness and dizziness that occurred while playing golf,  who presents for routine follow up.  Previous history of renal insufficiency with aggressive diuresis.  He was admitted to the hospital on September 28 and discharged 07/04/2012 with diagnosis of melena. He was on xarelto for DVT prophylaxis after left hip surgery. Anticoagulation was held, EGD was performed that showed erosive gastritis. No further melena. He did report having significant symptomatic PVCs and he was started on amiodarone 200 mg twice a day. Aspirin was stopped at the time of his GI bleed and this has not been restarted. Amlodipine was also is held for large hemi-swelling. He continues to have problems with psoriasis. He has insomnia which has been a chronic issue. Sometimes he takes Benadryl with mild relief but continues to be an issue. He is napping sometimes in the daytime. There are chronic leg swelling on the left, improved on the right  Ultrasound of his legs in the hospital showed no DVT, hematocrit on September 30 was 28 Chronic shortness of breath is stable   EKG shows Sinus rhythm with rate 82 beats per minute, right bundle branch block  Recent cholesterol is 135, LDL 72, HDL 29, creatinine 1.13  Outpatient Encounter Prescriptions as of 07/17/2012  Medication Sig Dispense Refill  . allopurinol (ZYLOPRIM) 300 MG tablet Take 1 tablet by mouth daily.      Marland Kitchen amiodarone (PACERONE) 200 MG tablet Take 200 mg by twice daily       . aspirin 81 MG tablet Take 81 mg by mouth daily.        Marland Kitchen atorvastatin (LIPITOR) 80 MG tablet  Take 40 mg by mouth daily.        Marland Kitchen COLCRYS 0.6 MG tablet Take 1 tablet by mouth daily as needed.       . ferrous fumarate-b12-vitamic C-folic acid (TRINSICON / FOLTRIN) capsule Take 1 capsule by mouth 2 (two) times daily.      . furosemide (LASIX) 40 MG tablet Take 40 mg by mouth daily.      . isosorbide mononitrate (IMDUR) 30 MG 24 hr tablet Take 1 tablet (30 mg total) by mouth daily.  30 tablet  11  . metoprolol (LOPRESSOR) 50 MG tablet Take 2 tablets (100 mg total) by mouth 2 (two) times daily.  120 tablet  6  . Multiple Vitamin (MULTI-VITAMIN PO) Take by mouth daily.        Marland Kitchen omeprazole (PRILOSEC) 20 MG capsule Take 20 mg by mouth 2 (two) times daily.        . ramipril (ALTACE) 10 MG capsule TAKE 1 CAPSULE (10 MG TOTAL) BY MOUTH 2 (TWO) TIMES DAILY.  60 capsule  6  . tiotropium (SPIRIVA) 18 MCG inhalation capsule Place 1 capsule (18 mcg total) into inhaler and inhale daily.  30 capsule  11     Review of Systems  Constitutional: Positive for fatigue.  HENT: Negative.   Eyes: Negative.   Respiratory: Positive for shortness of breath.   Cardiovascular: Positive for leg swelling.  Gastrointestinal: Negative.   Musculoskeletal: Negative.   Skin: Negative.   Hematological: Negative.   Psychiatric/Behavioral: Positive for disturbed wake/sleep  cycle.  All other systems reviewed and are negative.    BP 124/60  Pulse 82  Ht 5\' 8"  (1.727 m)  Wt 283 lb 8 oz (128.595 kg)  BMI 43.11 kg/m2  SpO2 96%  Physical Exam  Nursing note and vitals reviewed. Constitutional: He is oriented to person, place, and time. He appears well-developed and well-nourished.       Morbidly obese  HENT:  Head: Normocephalic.  Nose: Nose normal.  Mouth/Throat: Oropharynx is clear and moist.  Eyes: Conjunctivae normal are normal. Pupils are equal, round, and reactive to light.  Neck: Normal range of motion. Neck supple. No JVD present.  Cardiovascular: Normal rate, regular rhythm, S1 normal, S2 normal,  normal heart sounds and intact distal pulses.  Exam reveals no gallop and no friction rub.   No murmur heard.      1+ pitting edema to the mid shins on the left  Pulmonary/Chest: Effort normal. No respiratory distress. He has no wheezes. He has no rales. He exhibits no tenderness.       Decreased breath sounds throughout.  Abdominal: Soft. Bowel sounds are normal. He exhibits no distension. There is no tenderness.  Musculoskeletal: Normal range of motion. He exhibits edema. He exhibits no tenderness.  Lymphadenopathy:    He has no cervical adenopathy.  Neurological: He is alert and oriented to person, place, and time. Coordination normal.  Skin: Skin is warm and dry. No rash noted. No erythema.  Psychiatric: He has a normal mood and affect. His behavior is normal. Judgment and thought content normal.           Assessment and Plan

## 2012-07-17 NOTE — Assessment & Plan Note (Signed)
Currently with no symptoms of angina. No further workup at this time. Continue current medication regimen. We have suggested he restart baby aspirin every other day for several weeks. He does have followup with GI in the near future. He will stay on his proton pump inhibitor twice a day.

## 2012-07-17 NOTE — Assessment & Plan Note (Signed)
We have encouraged continued exercise, careful diet management in an effort to lose weight. 

## 2012-08-03 DIAGNOSIS — R0602 Shortness of breath: Secondary | ICD-10-CM

## 2012-08-03 DIAGNOSIS — I2589 Other forms of chronic ischemic heart disease: Secondary | ICD-10-CM

## 2012-08-03 DIAGNOSIS — I2510 Atherosclerotic heart disease of native coronary artery without angina pectoris: Secondary | ICD-10-CM

## 2012-08-03 DIAGNOSIS — I5032 Chronic diastolic (congestive) heart failure: Secondary | ICD-10-CM

## 2012-08-03 DIAGNOSIS — R609 Edema, unspecified: Secondary | ICD-10-CM

## 2012-08-03 DIAGNOSIS — I4891 Unspecified atrial fibrillation: Secondary | ICD-10-CM

## 2012-08-03 DIAGNOSIS — I10 Essential (primary) hypertension: Secondary | ICD-10-CM

## 2012-08-07 ENCOUNTER — Encounter: Payer: Medicare Other | Admitting: *Deleted

## 2012-08-13 ENCOUNTER — Other Ambulatory Visit: Payer: Self-pay | Admitting: Unknown Physician Specialty

## 2012-08-15 ENCOUNTER — Encounter: Payer: Self-pay | Admitting: *Deleted

## 2012-08-21 ENCOUNTER — Encounter: Payer: Self-pay | Admitting: Cardiovascular Disease

## 2012-08-22 ENCOUNTER — Ambulatory Visit (INDEPENDENT_AMBULATORY_CARE_PROVIDER_SITE_OTHER): Payer: Medicare Other | Admitting: *Deleted

## 2012-08-22 DIAGNOSIS — I4891 Unspecified atrial fibrillation: Secondary | ICD-10-CM

## 2012-08-22 DIAGNOSIS — Z950 Presence of cardiac pacemaker: Secondary | ICD-10-CM

## 2012-08-24 ENCOUNTER — Other Ambulatory Visit: Payer: Self-pay

## 2012-08-24 ENCOUNTER — Other Ambulatory Visit: Payer: Self-pay | Admitting: Cardiovascular Disease

## 2012-08-24 MED ORDER — AMIODARONE HCL 200 MG PO TABS: 200.0000 mg | ORAL_TABLET | Freq: Two times a day (BID) | ORAL | Status: DC

## 2012-08-24 NOTE — Telephone Encounter (Signed)
Refilled Amiodarone. 

## 2012-08-24 NOTE — Telephone Encounter (Signed)
Refill sent for amiodarone 200 mg bid

## 2012-08-25 ENCOUNTER — Telehealth: Payer: Self-pay | Admitting: Cardiovascular Disease

## 2012-08-25 NOTE — Telephone Encounter (Signed)
Error

## 2012-08-29 LAB — REMOTE PACEMAKER DEVICE
AL IMPEDENCE PM: 459 Ohm
AL THRESHOLD: 0.75 V
BAMS-0001: 160 {beats}/min
RV LEAD AMPLITUDE: 22.4 mv
RV LEAD IMPEDENCE PM: 556 Ohm
RV LEAD THRESHOLD: 0.875 V

## 2012-08-30 ENCOUNTER — Other Ambulatory Visit: Payer: Self-pay | Admitting: Cardiovascular Disease

## 2012-10-06 ENCOUNTER — Encounter: Payer: Self-pay | Admitting: *Deleted

## 2012-10-31 ENCOUNTER — Encounter: Payer: Self-pay | Admitting: Internal Medicine

## 2012-11-05 ENCOUNTER — Other Ambulatory Visit: Payer: Self-pay | Admitting: Emergency Medicine

## 2012-11-06 ENCOUNTER — Telehealth: Payer: Self-pay

## 2012-11-06 ENCOUNTER — Encounter: Payer: Medicare Other | Admitting: Cardiovascular Disease

## 2012-11-06 NOTE — Telephone Encounter (Signed)
Message copied by [facility], Aston Lieske E on Mon Nov 06, 2012 11:06 AM ------      Message from: Thersa Salt      Created: Mon Nov 06, 2012  9:28 AM      Regarding: TCM       Pt coming to see Thayer Ohm on Wednesday. Gollan wanted to see today but pt couldn't make it today.

## 2012-11-08 ENCOUNTER — Other Ambulatory Visit: Payer: Self-pay | Admitting: Emergency Medicine

## 2012-11-08 ENCOUNTER — Other Ambulatory Visit: Payer: Self-pay | Admitting: *Deleted

## 2012-11-08 ENCOUNTER — Encounter: Payer: Medicare Other | Admitting: Nurse Practitioner

## 2012-11-08 MED ORDER — FUROSEMIDE 40 MG PO TABS: 40.0000 mg | ORAL_TABLET | Freq: Every day | ORAL | Status: DC

## 2012-11-10 ENCOUNTER — Encounter: Payer: Medicare Other | Admitting: Cardiovascular Disease

## 2012-11-17 ENCOUNTER — Other Ambulatory Visit: Payer: Self-pay | Admitting: Emergency Medicine

## 2012-11-27 ENCOUNTER — Encounter: Payer: Medicare Other | Admitting: *Deleted

## 2012-12-07 ENCOUNTER — Encounter: Payer: Self-pay | Admitting: *Deleted

## 2012-12-21 ENCOUNTER — Other Ambulatory Visit: Payer: Self-pay | Admitting: Internal Medicine

## 2012-12-21 ENCOUNTER — Ambulatory Visit (INDEPENDENT_AMBULATORY_CARE_PROVIDER_SITE_OTHER): Payer: Medicare Other | Admitting: *Deleted

## 2012-12-21 DIAGNOSIS — I4891 Unspecified atrial fibrillation: Secondary | ICD-10-CM

## 2012-12-21 DIAGNOSIS — Z950 Presence of cardiac pacemaker: Secondary | ICD-10-CM

## 2012-12-25 ENCOUNTER — Other Ambulatory Visit: Payer: Self-pay | Admitting: Cardiovascular Disease

## 2013-01-02 ENCOUNTER — Other Ambulatory Visit: Payer: Self-pay | Admitting: Emergency Medicine

## 2013-01-03 LAB — REMOTE PACEMAKER DEVICE
AL IMPEDENCE PM: 446 Ohm
ATRIAL PACING PM: 96
RV LEAD IMPEDENCE PM: 552 Ohm
RV LEAD THRESHOLD: 0.877 V
VENTRICULAR PACING PM: 50

## 2013-01-16 ENCOUNTER — Encounter: Payer: Self-pay | Admitting: *Deleted

## 2013-01-22 ENCOUNTER — Encounter: Payer: Self-pay | Admitting: Internal Medicine

## 2013-03-16 ENCOUNTER — Other Ambulatory Visit: Payer: Self-pay | Admitting: Cardiovascular Disease

## 2013-04-02 ENCOUNTER — Other Ambulatory Visit: Payer: Self-pay | Admitting: Cardiovascular Disease

## 2013-04-12 ENCOUNTER — Encounter: Payer: Medicare Other | Admitting: Internal Medicine

## 2013-04-24 ENCOUNTER — Ambulatory Visit (INDEPENDENT_AMBULATORY_CARE_PROVIDER_SITE_OTHER): Payer: Medicare Other | Admitting: Cardiovascular Disease

## 2013-04-24 ENCOUNTER — Encounter: Payer: Self-pay | Admitting: Cardiovascular Disease

## 2013-04-24 VITALS — BP 128/72 | HR 67 | Ht 68.0 in | Wt 272.8 lb

## 2013-04-24 DIAGNOSIS — R0989 Other specified symptoms and signs involving the circulatory and respiratory systems: Secondary | ICD-10-CM

## 2013-04-24 DIAGNOSIS — I4891 Unspecified atrial fibrillation: Secondary | ICD-10-CM

## 2013-04-24 DIAGNOSIS — I25810 Atherosclerosis of coronary artery bypass graft(s) without angina pectoris: Secondary | ICD-10-CM

## 2013-04-24 DIAGNOSIS — I10 Essential (primary) hypertension: Secondary | ICD-10-CM

## 2013-04-24 DIAGNOSIS — R0609 Other forms of dyspnea: Secondary | ICD-10-CM

## 2013-04-24 DIAGNOSIS — E785 Hyperlipidemia, unspecified: Secondary | ICD-10-CM

## 2013-04-24 DIAGNOSIS — I5032 Chronic diastolic (congestive) heart failure: Secondary | ICD-10-CM

## 2013-04-24 DIAGNOSIS — I2510 Atherosclerotic heart disease of native coronary artery without angina pectoris: Secondary | ICD-10-CM

## 2013-04-24 NOTE — Assessment & Plan Note (Signed)
Maintaining normal sinus rhythm. We'll continue amiodarone given his previous history of symptomatic PVCs. Currently asymptomatic, feels well. Routine amiodarone surveillance labs on his next visit.

## 2013-04-24 NOTE — Patient Instructions (Signed)
You are doing well. No medication changes were made.  Please call us if you have new issues that need to be addressed before your next appt.  Your physician wants you to follow-up in: 6 months.  You will receive a reminder letter in the mail two months in advance. If you don't receive a letter, please call our office to schedule the follow-up appointment.   

## 2013-04-24 NOTE — Assessment & Plan Note (Signed)
Appears relatively euvolemic on today's visit. No changes to his medications. We'll continue Lasix daily. He does not drink much fluids

## 2013-04-24 NOTE — Assessment & Plan Note (Signed)
He reports shortness of breath is stable. Obesity continues to be a major problem. Poor conditioning.

## 2013-04-24 NOTE — Assessment & Plan Note (Signed)
Blood pressure is well controlled on today's visit. No changes made to the medications. 

## 2013-04-24 NOTE — Assessment & Plan Note (Signed)
We have encouraged continued exercise, careful diet management in an effort to lose weight. 

## 2013-04-24 NOTE — Progress Notes (Signed)
Patient ID: Scott Krause, male    DOB: 1941-02-07, 72 y.o.   MRN: [id number]  HPI Comments: 72 year old gentleman with severe obesity, coronary artery disease, bypass surgery with severe native three-vessel disease, obstructive sleep apnea, left catheterization Feb 2010 showing patent grafts, with pacemaker for tachybrady syndrome, admitted to the hospital for nonsustained VT and frequent PVCs, weakness and dizziness that occurred while playing golf,  who presents for routine follow up.  Previous history of renal insufficiency with aggressive diuresis.  admitted to the hospital on September 28 and discharged 07/04/2012 with diagnosis of melena. He was on xarelto for DVT prophylaxis after left hip surgery. Anticoagulation was held, EGD was performed that showed erosive gastritis.   significant symptomatic PVCs and he was started on amiodarone 200 mg twice a day. Aspirin was stopped at the time of his GI bleed and this has not been restarted.  Amlodipine was also is held for  leg swelling.  He has insomnia which has been a chronic issue.   There are chronic leg swelling on the left, improved on the right His biggest complaint is his left hip. He had hip surgery January 2014 with hip replacement. He feels that it is "broken" again. In January 2014 he reports having a IVC filter placed. He was unable to tolerate anticoagulation. Filter was removed 2 weeks ago He walks with a walker, has significant pain. He is scheduled to see orthopedics in 2 weeks. He feels that his breathing is stable, well controlled on Lasix daily  EKG shows Sinus rhythm with rate 67 beats per minute, right bundle branch block  Recent cholesterol is 135, LDL 72, HDL 29, creatinine 1.13  Outpatient Encounter Prescriptions as of 04/24/2013  Medication Sig Dispense Refill  . allopurinol (ZYLOPRIM) 300 MG tablet Take 1 tablet by mouth daily.      Marland Kitchen amiodarone (PACERONE) 200 MG tablet TAKE 1 TABLET BY MOUTH TWICE A DAY  60  tablet  3  . aspirin 81 MG tablet Take 81 mg by mouth daily.        Marland Kitchen atorvastatin (LIPITOR) 80 MG tablet Take 40 mg by mouth daily.        Marland Kitchen COLCRYS 0.6 MG tablet Take 1 tablet by mouth daily as needed.       . ferrous fumarate-b12-vitamic C-folic acid (TRINSICON / FOLTRIN) capsule Take 1 capsule by mouth 2 (two) times daily.      . furosemide (LASIX) 40 MG tablet Take 1 tablet (40 mg total) by mouth daily.  30 tablet  2  . isosorbide mononitrate (IMDUR) 30 MG 24 hr tablet TAKE 1 TABLET (30 MG TOTAL) BY MOUTH DAILY.  30 tablet  8  . metoprolol (LOPRESSOR) 50 MG tablet TAKE 2 TABLETS BY MOUTH TWICE A DAY  120 tablet  6  . Multiple Vitamin (MULTI-VITAMIN PO) Take by mouth daily.        Marland Kitchen omeprazole (PRILOSEC) 20 MG capsule Take 20 mg by mouth 2 (two) times daily.        . ramipril (ALTACE) 10 MG capsule TAKE 1 CAPSULE (10 MG TOTAL) BY MOUTH 2 (TWO) TIMES DAILY.  60 capsule  6  . tiotropium (SPIRIVA) 18 MCG inhalation capsule Place 1 capsule (18 mcg total) into inhaler and inhale daily.  30 capsule  11   No facility-administered encounter medications on file as of 04/24/2013.     Review of Systems  Constitutional: Positive for fatigue.  HENT: Negative.   Eyes: Negative.   Respiratory:  Positive for shortness of breath.   Cardiovascular: Positive for leg swelling.  Gastrointestinal: Negative.   Musculoskeletal: Negative.   Skin: Negative.   Psychiatric/Behavioral: Positive for sleep disturbance.  All other systems reviewed and are negative.    BP 128/72  Pulse 67  Ht 5\' 8"  (1.727 m)  Wt 272 lb 12.8 oz (123.741 kg)  BMI 41.49 kg/m2  Physical Exam  Nursing note and vitals reviewed. Constitutional: He is oriented to person, place, and time. He appears well-developed and well-nourished.  Morbidly obese  HENT:  Head: Normocephalic.  Nose: Nose normal.  Mouth/Throat: Oropharynx is clear and moist.  Eyes: Conjunctivae are normal. Pupils are equal, round, and reactive to light.   Neck: Normal range of motion. Neck supple. No JVD present.  Cardiovascular: Normal rate, regular rhythm, S1 normal, S2 normal, normal heart sounds and intact distal pulses.  Exam reveals no gallop and no friction rub.   No murmur heard. 1+ pitting edema to the mid shins on the left  Pulmonary/Chest: Effort normal. No respiratory distress. He has no wheezes. He has no rales. He exhibits no tenderness.  Decreased breath sounds throughout.  Abdominal: Soft. Bowel sounds are normal. He exhibits no distension. There is no tenderness.  Musculoskeletal: Normal range of motion. He exhibits edema. He exhibits no tenderness.  Lymphadenopathy:    He has no cervical adenopathy.  Neurological: He is alert and oriented to person, place, and time. Coordination normal.  Skin: Skin is warm and dry. No rash noted. No erythema.  Psychiatric: He has a normal mood and affect. His behavior is normal. Judgment and thought content normal.      Assessment and Plan

## 2013-04-24 NOTE — Assessment & Plan Note (Signed)
Currently with no symptoms of angina. No further workup at this time. Continue current medication regimen. 

## 2013-04-24 NOTE — Assessment & Plan Note (Signed)
Cholesterol is at goal on the current lipid regimen. No changes to the medications were made. He reports total cholesterol 150

## 2013-05-01 ENCOUNTER — Other Ambulatory Visit: Payer: Self-pay | Admitting: Cardiovascular Disease

## 2013-05-02 ENCOUNTER — Other Ambulatory Visit: Payer: Self-pay | Admitting: *Deleted

## 2013-05-02 MED ORDER — FUROSEMIDE 40 MG PO TABS: 40.0000 mg | ORAL_TABLET | Freq: Every day | ORAL | Status: AC

## 2013-05-02 NOTE — Telephone Encounter (Signed)
Refilled Furosemide sent to [facility]. 

## 2013-05-10 ENCOUNTER — Ambulatory Visit (INDEPENDENT_AMBULATORY_CARE_PROVIDER_SITE_OTHER): Payer: Medicare Other | Admitting: Internal Medicine

## 2013-05-10 ENCOUNTER — Encounter: Payer: Self-pay | Admitting: Internal Medicine

## 2013-05-10 VITALS — BP 165/67 | HR 84 | Ht 68.0 in | Wt 274.0 lb

## 2013-05-10 DIAGNOSIS — I4891 Unspecified atrial fibrillation: Secondary | ICD-10-CM

## 2013-05-10 DIAGNOSIS — I2589 Other forms of chronic ischemic heart disease: Secondary | ICD-10-CM

## 2013-05-10 DIAGNOSIS — Z950 Presence of cardiac pacemaker: Secondary | ICD-10-CM

## 2013-05-10 DIAGNOSIS — I5032 Chronic diastolic (congestive) heart failure: Secondary | ICD-10-CM

## 2013-05-10 LAB — PACEMAKER DEVICE OBSERVATION
AL IMPEDENCE PM: 396 Ohm
ATRIAL PACING PM: 98
BAMS-0001: 160 {beats}/min

## 2013-05-10 NOTE — Assessment & Plan Note (Signed)
He is maintaining sinus rhythm very nicely on amiodarone therapy. He'll continue his current medications.

## 2013-05-10 NOTE — Progress Notes (Signed)
HPI [name] returns today for followup. He is a very pleasant 72 year old man with morbid obesity and symptomatic bradycardia, status post pacemaker insertion. Also has coronary disease. He has a history of atrial arrhythmias is been maintained sinus rhythm on amiodarone therapy. In the interim, he has done well except for severe arthritis and multiple hip surgeries. Apparently he underwent surgery and fractured his prosthesis and had to have revisions. He is now walking with a walker. He has chronic dyspnea but no chest pain. No syncope. No Known Allergies   Current Outpatient Prescriptions  Medication Sig Dispense Refill  . allopurinol (ZYLOPRIM) 300 MG tablet Take 1 tablet by mouth daily.      Marland Kitchen amiodarone (PACERONE) 200 MG tablet TAKE 1 TABLET BY MOUTH TWICE A DAY  60 tablet  3  . aspirin 81 MG tablet Take 81 mg by mouth daily.        Marland Kitchen atorvastatin (LIPITOR) 80 MG tablet Take 40 mg by mouth daily.        . furosemide (LASIX) 40 MG tablet Take 1 tablet (40 mg total) by mouth daily.  30 tablet  3  . isosorbide mononitrate (IMDUR) 30 MG 24 hr tablet TAKE 1 TABLET (30 MG TOTAL) BY MOUTH DAILY.  30 tablet  8  . metoprolol (LOPRESSOR) 50 MG tablet TAKE 2 TABLETS BY MOUTH TWICE A DAY  120 tablet  6  . Multiple Vitamin (MULTI-VITAMIN PO) Take by mouth daily.        Marland Kitchen omeprazole (PRILOSEC) 20 MG capsule Take 20 mg by mouth 2 (two) times daily.        . ramipril (ALTACE) 10 MG capsule TAKE 1 CAPSULE (10 MG TOTAL) BY MOUTH 2 (TWO) TIMES DAILY.  60 capsule  6  . tiotropium (SPIRIVA) 18 MCG inhalation capsule Place 1 capsule (18 mcg total) into inhaler and inhale daily.  30 capsule  11   No current facility-administered medications for this visit.     Past Medical History  Diagnosis Date  . Morbid obesity   . CAD (coronary artery disease)   . COPD (chronic obstructive pulmonary disease)   . Cardiomyopathy     Ischemic  . Pacemaker   . Hypertension   . Hyperlipidemia   . Obstructive sleep  apnea   . Chronic diastolic heart failure   . GI bleed 2013    ROS:   All systems reviewed and negative except as noted in the HPI.   Past Surgical History  Procedure Laterality Date  . Coronary artery bypass graft    . Right hip replacement    . Nerve repair in left arm    . Cardiac pacemaker placement      PPM  Medtronic  . Eye surgery      left retina  . Left hip replacement  2013     Family History  Problem Relation Age of Onset  . Coronary artery disease Other   . Heart disease Neg Hx      History   Social History  . Marital Status: Widowed    Spouse Name: N/A    Number of Children: N/A  . Years of Education: N/A   Occupational History  . Not on file.   Social History Main Topics  . Smoking status: Former Smoker -- 2.00 packs/day for 20 years    Types: Cigarettes    Quit date: 11/06/1995  . Smokeless tobacco: Former Neurosurgeon     Comment: Quit 20 years ago  . Alcohol  Use: Yes     Comment: ocassional  . Drug Use: No  . Sexually Active: Not on file   Other Topics Concern  . Not on file   Social History Narrative   Widower, has girlfriend   Lives with son           BP 165/67  Pulse 84  Ht 5\' 8"  (1.727 m)  Wt 274 lb (124.286 kg)  BMI 41.67 kg/m2  Physical Exam:  obese appearing 72 year old man, NAD HEENT: Unremarkable Neck:  7 cm JVD, no thyromegally Back:  No CVA tenderness Lungs:  Clear with no wheezes, rales, or rhonchi. HEART:  Regular rate rhythm, no murmurs, no rubs, no clicks Abd:  Soft,obese, positive bowel sounds, no organomegally, no rebound, no guarding Ext:  2 plus pulses, no edema, no cyanosis, no clubbing Skin:  No rashes no nodules Neuro:  CN II through XII intact, motor grossly intact   DEVICE  Normal device function.  See PaceArt for details.   Assess/Plan:

## 2013-05-10 NOTE — Assessment & Plan Note (Signed)
His chronic diastolic heart failure is class II. He is limited by arthritis and hip problems as much as anything. I've encouraged the patient to maintain a low-sodium diet.

## 2013-05-10 NOTE — Assessment & Plan Note (Signed)
His Medtronic dual-chamber pacemaker is working normally. He is approximately 2 and half years of battery longevity. We'll recheck in several months.

## 2013-05-10 NOTE — Patient Instructions (Addendum)
Your physician wants you to follow-up in: 1 year with Dr Klein.  You will receive a reminder letter in the mail two months in advance. If you don't receive a letter, please call our office to schedule the follow-up appointment.  

## 2013-07-23 ENCOUNTER — Other Ambulatory Visit: Payer: Self-pay | Admitting: Cardiovascular Disease

## 2013-07-23 ENCOUNTER — Other Ambulatory Visit: Payer: Self-pay | Admitting: *Deleted

## 2013-07-23 MED ORDER — AMIODARONE HCL 200 MG PO TABS: ORAL_TABLET | ORAL | Status: DC

## 2013-07-23 NOTE — Telephone Encounter (Signed)
Requested Prescriptions   Signed Prescriptions Disp Refills  . amiodarone (PACERONE) 200 MG tablet 60 tablet 3    Sig: TAKE 1 TABLET BY MOUTH TWICE A DAY    Authorizing Provider: Antonieta Iba    Ordering User: Kendrick Fries

## 2013-08-13 ENCOUNTER — Encounter: Payer: Self-pay | Admitting: Internal Medicine

## 2013-08-13 ENCOUNTER — Ambulatory Visit (INDEPENDENT_AMBULATORY_CARE_PROVIDER_SITE_OTHER): Payer: Medicare Other | Admitting: *Deleted

## 2013-08-13 DIAGNOSIS — Z950 Presence of cardiac pacemaker: Secondary | ICD-10-CM

## 2013-08-13 DIAGNOSIS — I4891 Unspecified atrial fibrillation: Secondary | ICD-10-CM

## 2013-08-19 LAB — MDC_IDC_ENUM_SESS_TYPE_REMOTE
Brady Statistic AP VP Percent: 100 %
Brady Statistic AP VS Percent: 0 %
Brady Statistic AS VP Percent: 0 %
Brady Statistic AS VS Percent: 0 %
Date Time Interrogation Session: 20141110234720
Lead Channel Impedance Value: 481 Ohm
Lead Channel Impedance Value: 563 Ohm
Lead Channel Pacing Threshold Amplitude: 0.75 V
Lead Channel Pacing Threshold Amplitude: 0.75 V
Lead Channel Pacing Threshold Pulse Width: 0.4 ms
Lead Channel Setting Pacing Amplitude: 2 V
Lead Channel Setting Sensing Sensitivity: 5.6 mV

## 2013-08-22 ENCOUNTER — Encounter: Payer: Self-pay | Admitting: *Deleted

## 2013-10-11 ENCOUNTER — Other Ambulatory Visit: Payer: Self-pay | Admitting: Cardiovascular Disease

## 2013-10-22 ENCOUNTER — Ambulatory Visit: Payer: Medicare Other | Admitting: Cardiovascular Disease

## 2013-11-02 ENCOUNTER — Encounter: Payer: Self-pay | Admitting: Cardiovascular Disease

## 2013-11-02 ENCOUNTER — Ambulatory Visit (INDEPENDENT_AMBULATORY_CARE_PROVIDER_SITE_OTHER): Payer: Medicare Other | Admitting: Cardiovascular Disease

## 2013-11-02 VITALS — BP 118/60 | HR 66 | Ht 68.0 in | Wt 279.5 lb

## 2013-11-02 DIAGNOSIS — N259 Disorder resulting from impaired renal tubular function, unspecified: Secondary | ICD-10-CM

## 2013-11-02 DIAGNOSIS — E785 Hyperlipidemia, unspecified: Secondary | ICD-10-CM

## 2013-11-02 DIAGNOSIS — I5032 Chronic diastolic (congestive) heart failure: Secondary | ICD-10-CM

## 2013-11-02 DIAGNOSIS — I4891 Unspecified atrial fibrillation: Secondary | ICD-10-CM

## 2013-11-02 DIAGNOSIS — R0602 Shortness of breath: Secondary | ICD-10-CM

## 2013-11-02 DIAGNOSIS — I10 Essential (primary) hypertension: Secondary | ICD-10-CM

## 2013-11-02 DIAGNOSIS — I2510 Atherosclerotic heart disease of native coronary artery without angina pectoris: Secondary | ICD-10-CM

## 2013-11-02 NOTE — Assessment & Plan Note (Signed)
Currently with no symptoms of angina. No further workup at this time. Continue current medication regimen. 

## 2013-11-02 NOTE — Patient Instructions (Addendum)
You are doing well. No medication changes were made.  Ask Scott Krause about Trazodone 50 mg for sleep  Please call us if you have new issues that need to be addressed before your next appt.  Your physician wants you to follow-up in: 6 months.  You will receive a reminder letter in the mail two months in advance. If you don't receive a letter, please call our office to schedule the follow-up appointment.

## 2013-11-02 NOTE — Assessment & Plan Note (Signed)
He taking Lasix daily with extra dosing every third day. Given the climb in his creatinine and BUN, suggested he try not add any extra doses. He has been doing this for leg edema and shortness of breath. Leg edema could be secondary to venous insufficiency, contribution from morbid obesity.

## 2013-11-02 NOTE — Assessment & Plan Note (Signed)
Worsening renal function likely exacerbated by extra diuretic. Suggested he cut back on his extra dosing, increase his fluid intake mildly

## 2013-11-02 NOTE — Assessment & Plan Note (Signed)
rhythm is normal sinus rhythm today's visit. No changes to his medications

## 2013-11-02 NOTE — Progress Notes (Signed)
Patient ID: Scott Krause, male    DOB: 08-19-1941, 73 y.o.   MRN: [id number]  HPI Comments: 73 year old gentleman with severe obesity, coronary artery disease, bypass surgery with severe native three-vessel disease, obstructive sleep apnea, left catheterization Feb 2010 showing patent grafts, with pacemaker for tachybrady syndrome, admitted to the hospital for nonsustained VT and frequent PVCs, weakness and dizziness that occurred while playing golf,  who presents for routine follow up.  Previous history of renal insufficiency with aggressive diuresis.  hip surgery January 2014 with hip replacement.   In followup today, he reports that he is stable. Chronic left leg weakness now walking with a walker and a cane all the time. Continues to be afraid of falling. Realizes he will need an assisted living and is looking to sell his house. Weight has been slowly trending upwards. Summer 2014 weight was 272, now 279 pounds. Denies retaining water. He takes Lasix 40 mg daily. Takes 40 mg twice a day every third day. With this regimen, creatinine has climbed now 2.2 with elevated BUN of 50.  he has been told before that he is probably dehydrated. He does drink significant fluid in the daytime.  admitted to the hospital on July 01 2012  and discharged 10/01 with diagnosis of melena. He was on xarelto for DVT prophylaxis after left hip surgery. Anticoagulation was held, EGD was performed that showed erosive gastritis.   significant symptomatic PVCs and he was started on amiodarone 200 mg twice a day. Aspirin was stopped at the time of his GI bleed and this has not been restarted.  Amlodipine was also is held for  leg swelling.  He has insomnia which has been a chronic issue. He takes Benadryl. Does not want any and  There are chronic leg swelling on the left, improved on the right In January 2014 he reports having a IVC filter placed. He was unable to tolerate anticoagulation. Filter was removed 2 weeks  ago  EKG shows Sinus rhythm with rate 66 beats per minute, right bundle branch block  Recent cholesterol is 135, LDL 72, HDL 29, creatinine 1.13  Outpatient Encounter Prescriptions as of 11/02/2013  Medication Sig  . allopurinol (ZYLOPRIM) 300 MG tablet Take 1 tablet by mouth daily.  Marland Kitchen amiodarone (PACERONE) 200 MG tablet TAKE 1 TABLET BY MOUTH TWICE A DAY  . aspirin 81 MG tablet Take 81 mg by mouth daily.    Marland Kitchen atorvastatin (LIPITOR) 80 MG tablet Take 40 mg by mouth daily.    . furosemide (LASIX) 40 MG tablet Take 1 tablet (40 mg total) by mouth daily.  . isosorbide mononitrate (IMDUR) 30 MG 24 hr tablet TAKE 1 TABLET (30 MG TOTAL) BY MOUTH DAILY.  . metoprolol (LOPRESSOR) 50 MG tablet TAKE 2 TABLETS BY MOUTH TWICE A DAY  . Multiple Vitamin (MULTI-VITAMIN PO) Take by mouth daily.    Marland Kitchen omeprazole (PRILOSEC) 20 MG capsule Take 20 mg by mouth 2 (two) times daily.    . ramipril (ALTACE) 10 MG capsule TAKE 1 CAPSULE (10 MG TOTAL) BY MOUTH 2 (TWO) TIMES DAILY.  Marland Kitchen tiotropium (SPIRIVA) 18 MCG inhalation capsule Place 1 capsule (18 mcg total) into inhaler and inhale daily.  . [DISCONTINUED] amiodarone (PACERONE) 200 MG tablet TAKE 1 TABLET BY MOUTH TWICE A DAY     Review of Systems  Constitutional: Positive for fatigue.  HENT: Negative.   Eyes: Negative.   Respiratory: Positive for shortness of breath.   Cardiovascular: Positive for leg swelling.  Gastrointestinal:  Negative.   Musculoskeletal: Positive for gait problem.  Skin: Negative.   Psychiatric/Behavioral: Positive for sleep disturbance.  All other systems reviewed and are negative.    BP 118/60  Pulse 66  Ht 5\' 8"  (1.727 m)  Wt 279 lb 8 oz (126.78 kg)  BMI 42.51 kg/m2  Physical Exam  Nursing note and vitals reviewed. Constitutional: He is oriented to person, place, and time. He appears well-developed and well-nourished.  Morbidly obese  HENT:  Head: Normocephalic.  Nose: Nose normal.  Mouth/Throat: Oropharynx is clear  and moist.  Eyes: Conjunctivae are normal. Pupils are equal, round, and reactive to light.  Neck: Normal range of motion. Neck supple. No JVD present.  Cardiovascular: Normal rate, regular rhythm, S1 normal, S2 normal, normal heart sounds and intact distal pulses.  Exam reveals no gallop and no friction rub.   No murmur heard. 1+ pitting edema to the mid shins on the left  Pulmonary/Chest: Effort normal. No respiratory distress. He has no wheezes. He has no rales. He exhibits no tenderness.  Decreased breath sounds throughout.  Abdominal: Soft. Bowel sounds are normal. He exhibits no distension. There is no tenderness.  Musculoskeletal: Normal range of motion. He exhibits edema. He exhibits no tenderness.  Lymphadenopathy:    He has no cervical adenopathy.  Neurological: He is alert and oriented to person, place, and time. Coordination normal.  Skin: Skin is warm and dry. No rash noted. No erythema.  Psychiatric: He has a normal mood and affect. His behavior is normal. Judgment and thought content normal.      Assessment and Plan

## 2013-11-02 NOTE — Assessment & Plan Note (Signed)
Cholesterol is at goal on the current lipid regimen. No changes to the medications were made.  

## 2013-11-02 NOTE — Assessment & Plan Note (Signed)
Blood pressure is well controlled on today's visit. No changes made to the medications. 

## 2013-11-02 NOTE — Assessment & Plan Note (Signed)
Weight continues to be a major issue.  We have encouraged continued exercise, careful diet management in an effort to lose weight.

## 2013-11-16 ENCOUNTER — Encounter: Payer: Medicare Other | Admitting: *Deleted

## 2013-11-21 ENCOUNTER — Encounter: Payer: Self-pay | Admitting: *Deleted

## 2013-11-27 ENCOUNTER — Other Ambulatory Visit: Payer: Self-pay | Admitting: Cardiovascular Disease

## 2013-11-27 ENCOUNTER — Ambulatory Visit (INDEPENDENT_AMBULATORY_CARE_PROVIDER_SITE_OTHER): Payer: Medicare Other | Admitting: *Deleted

## 2013-11-27 DIAGNOSIS — I4891 Unspecified atrial fibrillation: Secondary | ICD-10-CM

## 2013-11-27 DIAGNOSIS — I472 Ventricular tachycardia, unspecified: Secondary | ICD-10-CM

## 2013-11-27 DIAGNOSIS — I4729 Other ventricular tachycardia: Secondary | ICD-10-CM

## 2013-11-27 LAB — MDC_IDC_ENUM_SESS_TYPE_REMOTE
Battery Voltage: 2.73 V
Brady Statistic AP VS Percent: 0 %
Brady Statistic AS VP Percent: 0 %
Brady Statistic AS VS Percent: 0 %
Date Time Interrogation Session: 20150224211106
Lead Channel Impedance Value: 459 Ohm
Lead Channel Impedance Value: 540 Ohm
Lead Channel Pacing Threshold Amplitude: 0.75 V
Lead Channel Pacing Threshold Amplitude: 1.125 V
Lead Channel Pacing Threshold Pulse Width: 0.4 ms
Lead Channel Pacing Threshold Pulse Width: 0.4 ms
Lead Channel Setting Pacing Amplitude: 1.5 V
MDC IDC MSMT BATTERY IMPEDANCE: 2030 Ohm
MDC IDC MSMT BATTERY REMAINING LONGEVITY: 23 mo
MDC IDC SET LEADCHNL RV PACING AMPLITUDE: 2.25 V
MDC IDC SET LEADCHNL RV PACING PULSEWIDTH: 0.4 ms
MDC IDC SET LEADCHNL RV SENSING SENSITIVITY: 5.6 mV
MDC IDC STAT BRADY AP VP PERCENT: 100 %

## 2013-12-03 ENCOUNTER — Other Ambulatory Visit: Payer: Self-pay | Admitting: Cardiovascular Disease

## 2013-12-05 ENCOUNTER — Other Ambulatory Visit: Payer: Self-pay | Admitting: Cardiovascular Disease

## 2013-12-27 ENCOUNTER — Other Ambulatory Visit: Payer: Self-pay

## 2013-12-27 ENCOUNTER — Other Ambulatory Visit: Payer: Self-pay | Admitting: Emergency Medicine

## 2013-12-27 DIAGNOSIS — R079 Chest pain, unspecified: Secondary | ICD-10-CM

## 2013-12-28 ENCOUNTER — Telehealth: Payer: Self-pay

## 2013-12-28 DIAGNOSIS — I4891 Unspecified atrial fibrillation: Secondary | ICD-10-CM

## 2013-12-28 NOTE — Telephone Encounter (Signed)
Patient contacted regarding discharge from [facility] on 12/28/13.  Patient understands to follow up with Nicole Kindred, [place] on 01/09/14 at 1:45 at [facility]. Patient understands discharge instructions? yes Patient understands medications and regiment? yes Patient understands to bring all medications to this visit? yes

## 2014-01-02 ENCOUNTER — Encounter: Payer: Self-pay | Admitting: *Deleted

## 2014-01-09 ENCOUNTER — Encounter: Payer: Self-pay | Admitting: *Deleted

## 2014-01-09 ENCOUNTER — Encounter: Payer: Medicare Other | Admitting: Physician Assistant

## 2014-01-10 ENCOUNTER — Encounter: Payer: Self-pay | Admitting: Internal Medicine

## 2014-02-17 ENCOUNTER — Other Ambulatory Visit: Payer: Self-pay | Admitting: Emergency Medicine

## 2014-02-17 ENCOUNTER — Other Ambulatory Visit: Payer: Self-pay

## 2014-02-17 DIAGNOSIS — A419 Sepsis, unspecified organism: Secondary | ICD-10-CM

## 2014-02-17 DIAGNOSIS — R092 Respiratory arrest: Secondary | ICD-10-CM

## 2014-02-17 DIAGNOSIS — C3490 Malignant neoplasm of unspecified part of unspecified bronchus or lung: Secondary | ICD-10-CM

## 2014-02-17 DIAGNOSIS — I4891 Unspecified atrial fibrillation: Secondary | ICD-10-CM

## 2014-02-17 DIAGNOSIS — I359 Nonrheumatic aortic valve disorder, unspecified: Secondary | ICD-10-CM

## 2014-02-17 DIAGNOSIS — I499 Cardiac arrhythmia, unspecified: Secondary | ICD-10-CM

## 2014-02-17 DIAGNOSIS — I5032 Chronic diastolic (congestive) heart failure: Secondary | ICD-10-CM

## 2014-02-18 ENCOUNTER — Encounter: Payer: Self-pay | Admitting: Cardiovascular Disease

## 2014-02-27 ENCOUNTER — Encounter: Payer: Medicare Other | Admitting: *Deleted

## 2014-02-27 ENCOUNTER — Telehealth: Payer: Self-pay | Admitting: Cardiology

## 2014-03-04 NOTE — Telephone Encounter (Signed)
Called pt to remind pt of remote transmission but was informed by a family member that pt passed away this morning.

## 2014-03-04 DEATH — deceased

## 2014-05-13 ENCOUNTER — Ambulatory Visit: Payer: Medicare Other | Admitting: Cardiovascular Disease

## 2016-01-30 IMAGING — CT CT ASPIRATION
2 of 3 series · 19 of 32 positions shown, 25 images · non-contrast
Comparison: none

CLINICAL DATA: Right upper lobe lung mass. The patient presents for
biopsy.

[Series 2: routine chest wo · axial · 0.76mm/px · z∈[+1215,+1355]mm · 8 of 33 slices shown]
[im 3/33  soft-tissue]
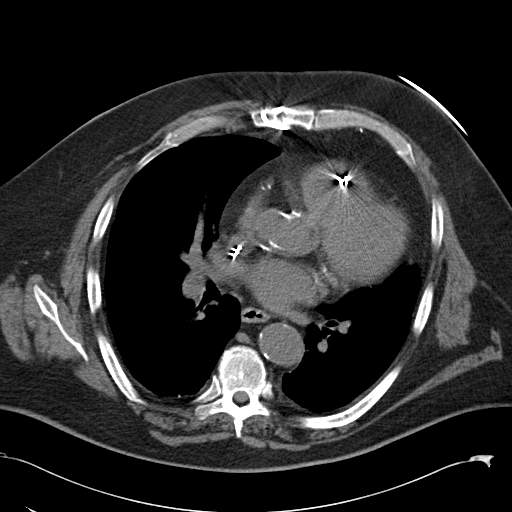
[im 7/33  soft-tissue]
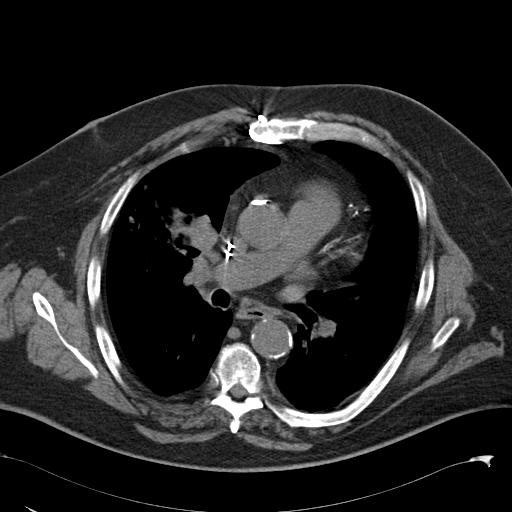
[im 11/33  soft-tissue]
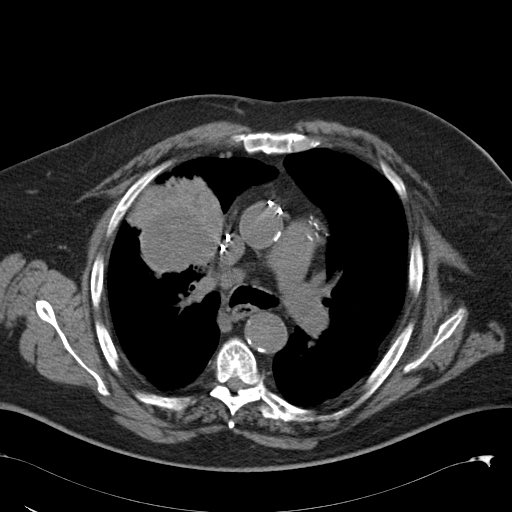
[im 15/33  soft-tissue]
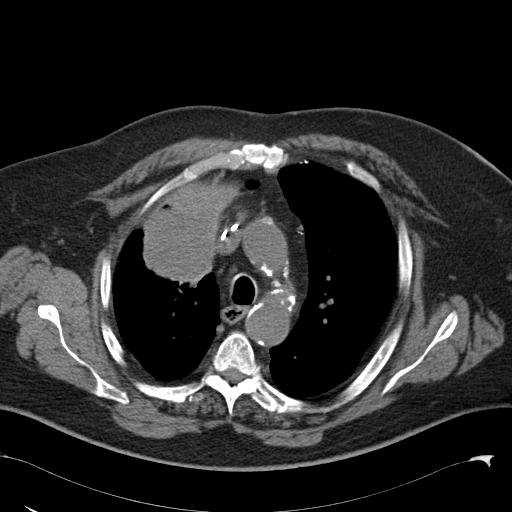
[im 19/33  soft-tissue]
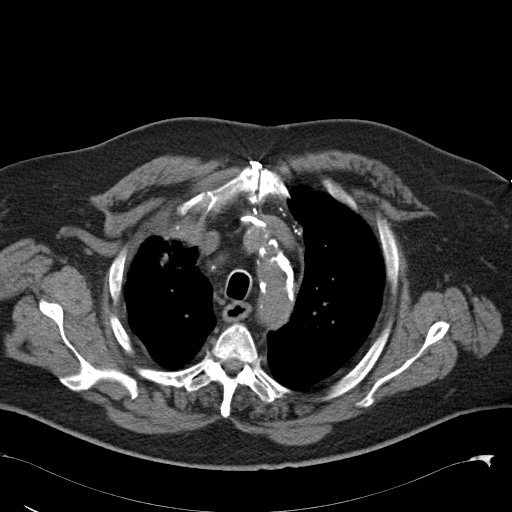
[im 23/33  soft-tissue]
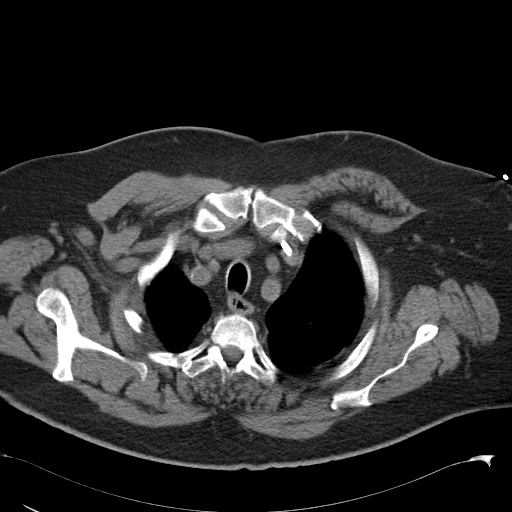
[im 27/33  soft-tissue]
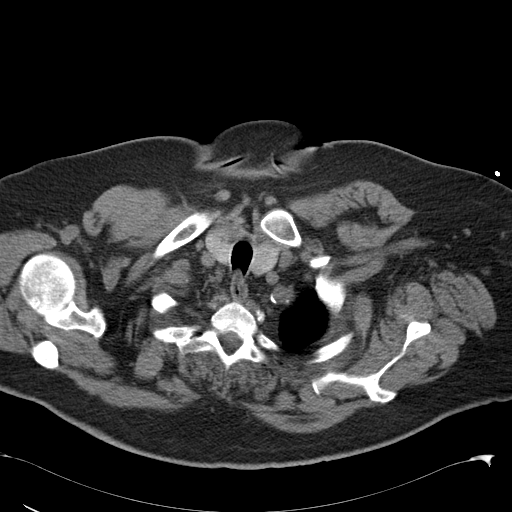
[im 31/33  soft-tissue]
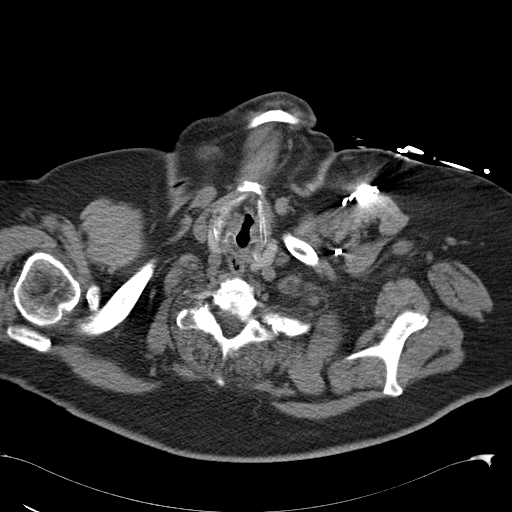

[Series 6: (hospital) 2.4 b30s · axial · 1.48mm/px · z∈[+1250,+1258]mm · 11 of 24 slices shown, 17 images]
[im 2/24  soft-tissue]
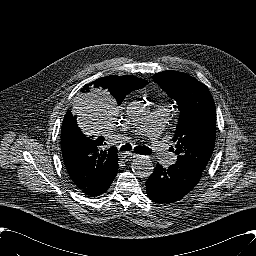
[im 2/24  bone]
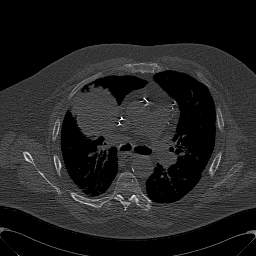
[im 4/24  soft-tissue]
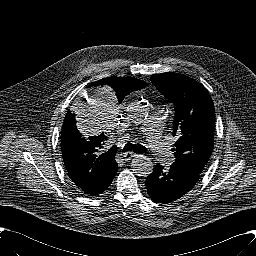
[im 6/24  soft-tissue]
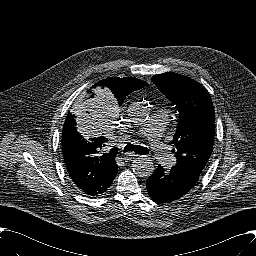
[im 8/24  soft-tissue]
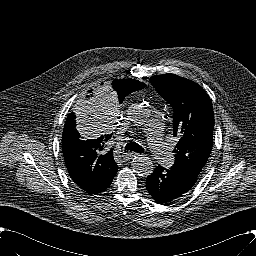
[im 10/24  soft-tissue]
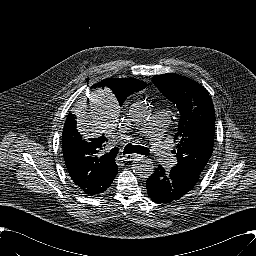
[im 10/24  lung]
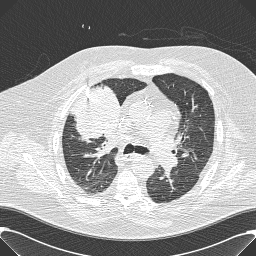
[im 12/24  soft-tissue]
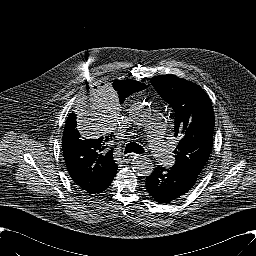
[im 14/24  soft-tissue]
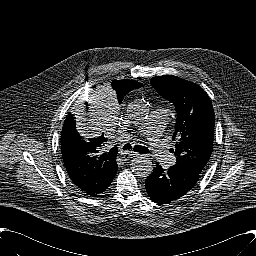
[im 16/24  soft-tissue]
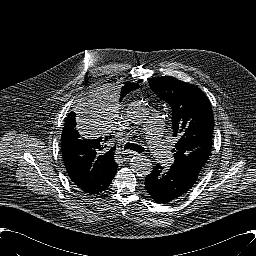
[im 18/24  soft-tissue]
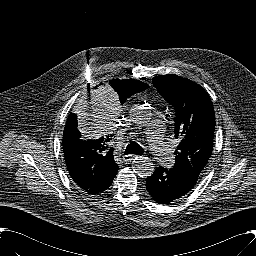
[im 18/24  lung]
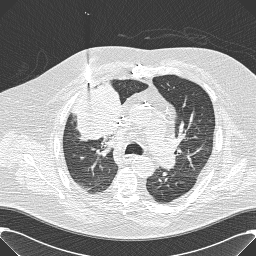
[im 18/24  bone]
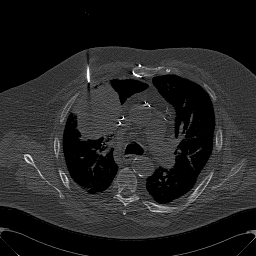
[im 20/24  soft-tissue]
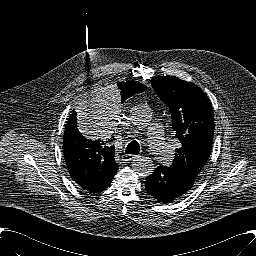
[im 20/24  lung]
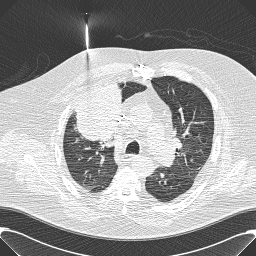
[im 22/24  soft-tissue]
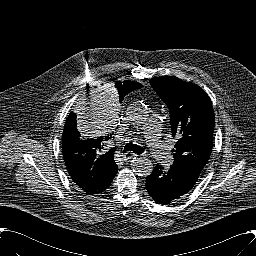
[im 22/24  lung]
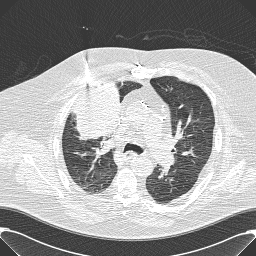

[19 of 32 positions shown; findings below may reference images not displayed]

EXAM:
CT GUIDED CORE BIOPSY OF RIGHT LUNG MASS

ANESTHESIA/SEDATION:
1.0  Mg IV Versed; 75 mcg IV Fentanyl

Total Moderate Sedation Time: 20 minutes.

PROCEDURE:
The procedure risks, benefits, and alternatives were explained to
the patient. Questions regarding the procedure were encouraged and
answered. The patient understands and consents to the procedure.

The right chest wall was prepped with chlorhexidine in a sterile
fashion, and a sterile drape was applied covering the operative
field. A sterile gown and sterile gloves were used for the
procedure. Local anesthesia was provided with 1% Lidocaine.

CT was performed in a supine position. Under CT guidance, a 17 gauge
needle was advanced into the right upper lobe mass. Two separate
coaxial 18 gauge core biopsy samples were obtained and submitted in
formalin. Post biopsy imaging was performed with CT.

COMPLICATIONS:
No immediate complications.  No evidence of pneumothorax.
FINDINGS: Large mass of the anterior right upper lobe now measures
approximately 8 cm. Solid tissue was obtained from the lateral
aspect of the mass.
IMPRESSION: CT-guided core biopsy performed of right upper lobe lung mass.
